# Patient Record
Sex: Female | Born: 1995
Health system: Southern US, Community
[De-identification: ages and names within clinical notes are randomized; demographics above are authoritative.]

## PROBLEM LIST (undated history)

## (undated) DIAGNOSIS — D571 Sickle-cell disease without crisis: Secondary | ICD-10-CM

## (undated) HISTORY — DX: Sickle-cell disease without crisis: D57.1

---
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
## 2016-06-28 ENCOUNTER — Encounter: Payer: Self-pay | Admitting: Obstetrics and Gynecology

## 2016-06-28 ENCOUNTER — Ambulatory Visit (INDEPENDENT_AMBULATORY_CARE_PROVIDER_SITE_OTHER): Payer: Medicaid Other | Admitting: Obstetrics and Gynecology

## 2016-06-28 ENCOUNTER — Other Ambulatory Visit (HOSPITAL_COMMUNITY)
Admission: RE | Admit: 2016-06-28 | Discharge: 2016-06-28 | Disposition: A | Discharge status: Home / Self Care | Payer: Medicaid Other | Source: Ambulatory Visit | Attending: Obstetrics and Gynecology | Admitting: Obstetrics and Gynecology

## 2016-06-28 DIAGNOSIS — Z01419 Encounter for gynecological examination (general) (routine) without abnormal findings: Secondary | ICD-10-CM

## 2016-06-28 DIAGNOSIS — Z0000 Encounter for general adult medical examination without abnormal findings: Secondary | ICD-10-CM | POA: Diagnosis not present

## 2016-06-28 DIAGNOSIS — Z202 Contact with and (suspected) exposure to infections with a predominantly sexual mode of transmission: Secondary | ICD-10-CM | POA: Insufficient documentation

## 2016-06-28 DIAGNOSIS — Z308 Encounter for other contraceptive management: Secondary | ICD-10-CM | POA: Insufficient documentation

## 2016-06-28 MED ORDER — NORETHIN ACE-ETH ESTRAD-FE 1-20 MG-MCG PO TABS: 1.0000 | ORAL_TABLET | Freq: Every day | ORAL | 11 refills | Status: DC

## 2016-06-28 NOTE — Addendum Note (Signed)
Addended by: Natale MilchSTALLING, Kajal Scalici D on: 06/28/2016 01:23 PM   Modules accepted: Orders

## 2016-06-28 NOTE — Progress Notes (Signed)
Patient is in the office for annual and to discuss getting back on BC pills. Pt stopped taking pills in May 2018.    Tammy Andrade is a 21 y.o.  female here for a routine annual gynecologic exam.  She denies any GYN problems. Previously on Junel, doing well, just needs refill. Sexual active without problems. No H/O STD's. No chronic medical problems or medications.   Denies abnormal vaginal bleeding, discharge, pelvic pain, problems with intercourse or other gynecologic concerns.    Gynecologic History Patient's last menstrual period was 06/06/2016. Contraception: OCP (estrogen/progesterone) Last Pap: NA.  Last mammogram: NA.   Obstetric History OB History  No data available    Past Medical History:  Diagnosis Date  . Sickle cell anemia (HCC) 1997    Past Surgical History:  Procedure Laterality Date  . LEG SURGERY  2003   broken bone    No current outpatient prescriptions on file prior to visit.   No current facility-administered medications on file prior to visit.     No Known Allergies  Social History   Social History  . Marital status: Single    Spouse name: N/A  . Number of children: N/A  . Years of education: N/A   Occupational History  . Not on file.   Social History Main Topics  . Smoking status: Never Smoker  . Smokeless tobacco: Not on file  . Alcohol use No  . Drug use: No  . Sexual activity: Yes    Birth control/ protection: Condom   Other Topics Concern  . Not on file   Social History Narrative  . No narrative on file    Family History  Problem Relation Age of Onset  . Diabetes Maternal Grandfather   . Hypertension Paternal Aunt     The following portions of the patient's history were reviewed and updated as appropriate: allergies, current medications, past family history, past medical history, past social history, past surgical history and problem list.  Review of Systems Pertinent items noted in HPI and remainder of comprehensive ROS  otherwise negative.   Objective:  BP 113/75   Pulse 92   Ht 5\' 8"  (1.727 m)   Wt 156 lb 14.4 oz (71.2 kg)   LMP 06/06/2016   BMI 23.86 kg/m  CONSTITUTIONAL: Well-developed, well-nourished female in no acute distress.  HENT:  Normocephalic, atraumatic, External right and left ear normal. Oropharynx is clear and moist EYES: Conjunctivae and EOM are normal. Pupils are equal, round, and reactive to light. No scleral icterus.  NECK: Normal range of motion, supple, no masses.  Normal thyroid.  SKIN: Skin is warm and dry. No rash noted. Not diaphoretic. No erythema. No pallor. NEUROLGIC: Alert and oriented to person, place, and time. Normal reflexes, muscle tone coordination. No cranial nerve deficit noted. PSYCHIATRIC: Normal mood and affect. Normal behavior. Normal judgment and thought content. CARDIOVASCULAR: Normal heart rate noted, regular rhythm RESPIRATORY: Clear to auscultation bilaterally. Effort and breath sounds normal, no problems with respiration noted. BREASTS: Symmetric in size. No masses, skin changes, nipple drainage, or lymphadenopathy. ABDOMEN: Soft, normal bowel sounds, no distention noted.  No tenderness, rebound or guarding.  PELVIC: Deferred MUSCULOSKELETAL: Normal range of motion. No tenderness.  No cyanosis, clubbing, or edema.  2+ distal pulses.   Assessment:  Annual gynecologic  Contraceptive management STD exposure   Plan:  Will renew Junel. Back up method recommended. R/B/U reviewed Routine preventative health maintenance measures emphasized. F/U in 1 yr or PRN Please refer to After Visit  Summary for other counseling recommendations.    Chancy Milroy, MD, [place] Attending [place] for [facility], [place]

## 2016-06-28 NOTE — Patient Instructions (Signed)

## 2016-06-29 LAB — GC/CHLAMYDIA PROBE AMP (~~LOC~~) NOT AT ARMC
Chlamydia: NEGATIVE
Neisseria Gonorrhea: NEGATIVE

## 2016-09-28 DIAGNOSIS — H5203 Hypermetropia, bilateral: Secondary | ICD-10-CM | POA: Diagnosis not present

## 2017-01-17 ENCOUNTER — Encounter: Payer: Self-pay | Admitting: Certified Nurse Midwife

## 2017-01-17 ENCOUNTER — Ambulatory Visit: Payer: Self-pay | Admitting: Certified Nurse Midwife

## 2017-01-17 VITALS — BP 112/81 | HR 94 | Wt 166.3 lb

## 2017-01-17 DIAGNOSIS — N939 Abnormal uterine and vaginal bleeding, unspecified: Secondary | ICD-10-CM | POA: Diagnosis not present

## 2017-01-17 DIAGNOSIS — Z113 Encounter for screening for infections with a predominantly sexual mode of transmission: Secondary | ICD-10-CM | POA: Diagnosis not present

## 2017-01-17 NOTE — Progress Notes (Signed)
Patient ID: Tammy Andrade, female   DOB: [birth year], 22 y.o.   MRN: [id number]  Chief Complaint  Patient presents with  . Vaginal Bleeding    Only during/after sex    HPI Tammy Andrade is a 22 y.o. female.  Here for spotting during sex/after sex X2 episodes.  Reports pain with deep penetration especially "doggy style".  Different sexual intercourse positions discussed.  Has a new partner for 4 months.  Swab obtained.  Declines blood STD testing.  Newly turned 21 since last exam, has changed insurances and desires full annual exam with pap smear.    HPI  Past Medical History:  Diagnosis Date  . Sickle cell anemia (HCC) 1997    Past Surgical History:  Procedure Laterality Date  . LEG SURGERY  2003   broken bone    Family History  Problem Relation Age of Onset  . Diabetes Maternal Grandfather   . Hypertension Paternal Aunt     Social History Social History   Tobacco Use  . Smoking status: Never Smoker  . Smokeless tobacco: Never Used  Substance Use Topics  . Alcohol use: No  . Drug use: No    No Known Allergies  Current Outpatient Medications  Medication Sig Dispense Refill  . norethindrone-ethinyl estradiol (JUNEL FE 1/20) 1-20 MG-MCG tablet Take 1 tablet by mouth daily. 1 Package 11   No current facility-administered medications for this visit.     Review of Systems Review of Systems Constitutional: negative for fatigue and weight loss Respiratory: negative for cough and wheezing Cardiovascular: negative for chest pain, fatigue and palpitations Gastrointestinal: negative for abdominal pain and change in bowel habits Genitourinary: +AUB Integument/breast: negative for nipple discharge Musculoskeletal:negative for myalgias Neurological: negative for gait problems and tremors Behavioral/Psych: negative for abusive relationship, depression Endocrine: negative for temperature intolerance      Blood pressure 112/81, pulse 94, weight 166 lb 4.8 oz (75.4 kg), last  menstrual period 01/11/2017.  Physical Exam Physical Exam General:   alert  Skin:   no rash or abnormalities  Lungs:   clear to auscultation bilaterally  Heart:   regular rate and rhythm, S1, S2 normal, no murmur, click, rub or gallop  Breasts:   deferred  Abdomen:  normal findings: no organomegaly, soft, non-tender and no hernia  Pelvis:  External genitalia: normal general appearance Urinary system: urethral meatus normal and bladder without fullness, nontender Vaginal: normal without tenderness, induration or masses Cervix: no CMT Adnexa: normal bimanual exam Uterus: anteverted and non-tender, normal size    50% of 15 min visit spent on counseling and coordination of care.   Data Reviewed Previous medical hx, meds, labs  Assessment      AUB with sexual intercourse r/t position  Screening for STDs  High risk sexual behaviors    Plan  [place] Swab  Continue Junelle Contraception/OCPs  F/U Annual exam with pap smear within 6 months.

## 2017-01-18 LAB — CERVICOVAGINAL ANCILLARY ONLY
Bacterial vaginitis: POSITIVE — AB
CHLAMYDIA, DNA PROBE: NEGATIVE
Candida vaginitis: NEGATIVE
Neisseria Gonorrhea: NEGATIVE
TRICH (WINDOWPATH): NEGATIVE

## 2017-01-25 ENCOUNTER — Other Ambulatory Visit: Payer: Self-pay | Admitting: Certified Nurse Midwife

## 2017-01-25 DIAGNOSIS — N760 Acute vaginitis: Secondary | ICD-10-CM

## 2017-01-25 DIAGNOSIS — B9689 Other specified bacterial agents as the cause of diseases classified elsewhere: Secondary | ICD-10-CM

## 2017-01-25 MED ORDER — TINIDAZOLE 500 MG PO TABS: 2.0000 g | ORAL_TABLET | Freq: Every day | ORAL | 0 refills | Status: DC

## 2017-02-06 ENCOUNTER — Inpatient Hospital Stay (HOSPITAL_COMMUNITY)
Admission: EM | Admit: 2017-02-06 | Discharge: 2017-02-08 | DRG: 690 | Disposition: A | Discharge status: Home / Self Care | Payer: BLUE CROSS/BLUE SHIELD | Attending: Internal Medicine | Admitting: Internal Medicine

## 2017-02-06 ENCOUNTER — Emergency Department (HOSPITAL_COMMUNITY): Payer: BLUE CROSS/BLUE SHIELD

## 2017-02-06 ENCOUNTER — Encounter (HOSPITAL_COMMUNITY): Payer: Self-pay | Admitting: Emergency Medicine

## 2017-02-06 ENCOUNTER — Other Ambulatory Visit: Payer: Self-pay

## 2017-02-06 DIAGNOSIS — R5081 Fever presenting with conditions classified elsewhere: Secondary | ICD-10-CM | POA: Diagnosis present

## 2017-02-06 DIAGNOSIS — D571 Sickle-cell disease without crisis: Secondary | ICD-10-CM | POA: Diagnosis not present

## 2017-02-06 DIAGNOSIS — D72829 Elevated white blood cell count, unspecified: Secondary | ICD-10-CM | POA: Diagnosis not present

## 2017-02-06 DIAGNOSIS — N390 Urinary tract infection, site not specified: Secondary | ICD-10-CM | POA: Diagnosis not present

## 2017-02-06 DIAGNOSIS — N12 Tubulo-interstitial nephritis, not specified as acute or chronic: Secondary | ICD-10-CM | POA: Diagnosis present

## 2017-02-06 DIAGNOSIS — N10 Acute tubulo-interstitial nephritis: Secondary | ICD-10-CM | POA: Diagnosis not present

## 2017-02-06 DIAGNOSIS — D5720 Sickle-cell/Hb-C disease without crisis: Secondary | ICD-10-CM | POA: Diagnosis not present

## 2017-02-06 DIAGNOSIS — R109 Unspecified abdominal pain: Secondary | ICD-10-CM | POA: Diagnosis not present

## 2017-02-06 DIAGNOSIS — N898 Other specified noninflammatory disorders of vagina: Secondary | ICD-10-CM | POA: Diagnosis not present

## 2017-02-06 LAB — URINALYSIS, ROUTINE W REFLEX MICROSCOPIC
BILIRUBIN URINE: NEGATIVE
Glucose, UA: NEGATIVE mg/dL
Ketones, ur: 20 mg/dL — AB
Nitrite: POSITIVE — AB
PH: 6 (ref 5.0–8.0)
Protein, ur: 100 mg/dL — AB
SPECIFIC GRAVITY, URINE: 1.01 (ref 1.005–1.030)

## 2017-02-06 LAB — I-STAT CG4 LACTIC ACID, ED
LACTIC ACID, VENOUS: 1.82 mmol/L (ref 0.5–1.9)
LACTIC ACID, VENOUS: 0.62 mmol/L (ref 0.5–1.9)

## 2017-02-06 LAB — CBC WITH DIFFERENTIAL/PLATELET
BASOS ABS: 0 10*3/uL (ref 0.0–0.1)
Basophils Relative: 0 %
Eosinophils Absolute: 0 10*3/uL (ref 0.0–0.7)
Eosinophils Relative: 0 %
HCT: 28.3 % — ABNORMAL LOW (ref 36.0–46.0)
Hemoglobin: 10.4 g/dL — ABNORMAL LOW (ref 12.0–15.0)
LYMPHS PCT: 12 %
Lymphs Abs: 3.3 10*3/uL (ref 0.7–4.0)
MCH: 29.5 pg (ref 26.0–34.0)
MCHC: 36.7 g/dL — ABNORMAL HIGH (ref 30.0–36.0)
MCV: 80.4 fL (ref 78.0–100.0)
Monocytes Absolute: 3 10*3/uL — ABNORMAL HIGH (ref 0.1–1.0)
Monocytes Relative: 11 %
NEUTROS PCT: 77 %
Neutro Abs: 21 10*3/uL — ABNORMAL HIGH (ref 1.7–7.7)
PLATELETS: 379 10*3/uL (ref 150–400)
RBC: 3.52 MIL/uL — ABNORMAL LOW (ref 3.87–5.11)
RDW: 14.1 % (ref 11.5–15.5)
WBC: 27.3 10*3/uL — AB (ref 4.0–10.5)

## 2017-02-06 LAB — BASIC METABOLIC PANEL
ANION GAP: 11 (ref 5–15)
BUN: 6 mg/dL (ref 6–20)
CALCIUM: 9.2 mg/dL (ref 8.9–10.3)
CO2: 24 mmol/L (ref 22–32)
Chloride: 103 mmol/L (ref 101–111)
Creatinine, Ser: 0.68 mg/dL (ref 0.44–1.00)
GFR calc Af Amer: >60 mL/min (ref >60)
Glucose, Bld: 72 mg/dL (ref 65–99)
POTASSIUM: 3.9 mmol/L (ref 3.5–5.1)
SODIUM: 138 mmol/L (ref 135–145)

## 2017-02-06 LAB — I-STAT BETA HCG BLOOD, ED (MC, WL, AP ONLY): HCG, QUANTITATIVE: 38.8 m[IU]/mL — AB (ref <5)

## 2017-02-06 LAB — HCG, QUANTITATIVE, PREGNANCY

## 2017-02-06 MED ORDER — DEXTROSE 5 % IV SOLN
1.0000 g | INTRAVENOUS | Status: DC
  Administered 2017-02-06: 1 g via INTRAVENOUS
  Filled 2017-02-06: qty 10

## 2017-02-06 NOTE — ED Provider Notes (Signed)
[facility]-EMERGENCY DEPT Provider Note   CSN: [id number] Arrival date & time: 02/06/17  1601     History   Chief Complaint Chief Complaint  Patient presents with  . elevated WBC  . Urinary Frequency    HPI Terriah Reggio is a 22 y.o. female.  HPI Patient presents with fevers dysuria and bilateral flank pain.  Began around 4-5 days ago.  [facility]en [facility] student [facility] center and had urinary tract infection and had systemic white count of greater than 20,000.  Pain is on both sides.  Has a history of sickle cell.  States she thinks she has type [place] but she is not sure.  States she has a doctor down in [place] at no font for it.  Has had nausea without vomiting.  Some mild abdominal pain in the upper abdomen also.  No cough. Past Medical History:  Diagnosis Date  . Sickle cell anemia (HCC) 1997    Patient Active Problem List   Diagnosis Date Noted  . Gynecologic exam normal 06/28/2016  . Encounter for other contraceptive management 06/28/2016  . STD exposure 06/28/2016    Past Surgical History:  Procedure Laterality Date  . LEG SURGERY  2003   broken bone    OB History    Gravida Para Term Preterm AB Living   0 0 0 0 0 0   SAB TAB Ectopic Multiple Live Births   0 0 0 0 0       Home Medications    Prior to Admission medications   Medication Sig Start Date End Date Taking? Authorizing Provider  ibuprofen (ADVIL,MOTRIN) 200 MG tablet Take 200 mg by mouth every 6 (six) hours as needed for fever or moderate pain.   Yes [provider]  norethindrone-ethinyl estradiol (JUNEL FE 1/20) 1-20 MG-MCG tablet Take 1 tablet by mouth daily. 06/28/16  Yes Hermina Staggers, MD  tinidazole (TINDAMAX) 500 MG tablet Take 4 tablets (2,000 mg total) by mouth daily with breakfast. Patient not taking: Reported on 02/06/2017 01/25/17   Roe Coombs, CNM    Family History Family History  Problem Relation Age of Onset  . Diabetes Maternal Grandfather   .  Hypertension Paternal Aunt     Social History Social History   Tobacco Use  . Smoking status: Never Smoker  . Smokeless tobacco: Never Used  Substance Use Topics  . Alcohol use: No  . Drug use: No     Allergies   Patient has no known allergies.   Review of Systems Review of Systems  Constitutional: Positive for appetite change and fever.  HENT: Negative for congestion.   Respiratory: Negative for shortness of breath.   Cardiovascular: Negative for chest pain.  Gastrointestinal: Positive for abdominal pain.  Genitourinary: Positive for dysuria and flank pain. Negative for decreased urine volume and pelvic pain.  Musculoskeletal: Positive for back pain.  Skin: Negative for rash.  Neurological: Negative for syncope.  Hematological: Negative for adenopathy.  Psychiatric/Behavioral: Negative for confusion.     Physical Exam Updated Vital Signs BP 125/86   Pulse 98   Temp (!) 101.5 F (38.6 C) (Oral)   Resp 18   Ht 5\' 8"  (1.727 m)   Wt 73.5 kg (162 lb)   LMP 01/23/2017   SpO2 100%   BMI 24.63 kg/m   Physical Exam  Constitutional: She appears well-developed.  HENT:  Head: Atraumatic.  Eyes: EOM are normal.  Cardiovascular:  Tachycardia  Abdominal: There is tenderness.  Mild  bilateral upper abdominal tenderness without rebound or guarding.  Genitourinary:  Genitourinary Comments: Bilateral CVA tenderness  Musculoskeletal: She exhibits no edema.  Neurological: She is alert.     ED Treatments / Results  Labs (all labs ordered are listed, but only abnormal results are displayed) Labs Reviewed  CBC WITH DIFFERENTIAL/PLATELET - Abnormal; Notable for the following components:      Result Value   WBC 27.3 (*)    RBC 3.52 (*)    Hemoglobin 10.4 (*)    HCT 28.3 (*)    MCHC 36.7 (*)    Neutro Abs 21.0 (*)    Monocytes Absolute 3.0 (*)    All other components within normal limits  URINALYSIS, ROUTINE W REFLEX MICROSCOPIC - Abnormal; Notable for the  following components:   Color, Urine AMBER (*)    APPearance CLOUDY (*)    Hgb urine dipstick MODERATE (*)    Ketones, ur 20 (*)    Protein, ur 100 (*)    Nitrite POSITIVE (*)    Leukocytes, UA LARGE (*)    Bacteria, UA MANY (*)    Squamous Epithelial / LPF 0-5 (*)    Non Squamous Epithelial 0-5 (*)    All other components within normal limits  I-STAT BETA HCG BLOOD, ED (MC, [facility], AP ONLY) - Abnormal; Notable for the following components:   I-stat hCG, quantitative 38.8 (*)    All other components within normal limits  URINE CULTURE  BASIC METABOLIC PANEL  HCG, QUANTITATIVE, PREGNANCY  I-STAT CG4 LACTIC ACID, ED  I-STAT CG4 LACTIC ACID, ED    EKG  EKG Interpretation None       Radiology Ct Renal Stone Study  Result Date: 02/06/2017 CLINICAL DATA:  Bilateral flank pain. Urinary tract infection. Elevated white count. EXAM: CT ABDOMEN AND PELVIS WITHOUT CONTRAST TECHNIQUE: Multidetector CT imaging of the abdomen and pelvis was performed following the standard protocol without IV contrast. COMPARISON:  None. FINDINGS: Lower chest: Mild abnormal density in both lower lungs consistent with atelectasis. No pleural effusion. Hepatobiliary: Normal without contrast. Pancreas: Normal Spleen: Non-specific 1.5 cm low-density in the upper portion, quite likely benign and insignificant. Adrenals/Urinary Tract: Adrenal glands are normal. Kidneys are normal without contrast. No sign of surrounding inflammatory change or detectable hydronephrosis. Bladder appears normal. Stomach/Bowel: No abnormal bowel finding. Vascular/Lymphatic: Normal Reproductive: Normal. Other: No free fluid or air. Musculoskeletal: Normal IMPRESSION: Negative noncontrast CT. No definable urinary tract pathology by this exam. No stone disease. No sign of hydronephrosis. Electronically Signed   By: Paulina Fusi M.D.   On: 02/06/2017 20:40    Procedures Procedures (including critical care time)  Medications Ordered in  ED Medications  cefTRIAXone (ROCEPHIN) 1 g in dextrose 5 % 50 mL IVPB (0 g Intravenous Stopped 02/06/17 1841)     Initial Impression / Assessment and Plan / ED Course  I have reviewed the triage vital signs and the nursing notes.  Pertinent labs & imaging results that were available during my care of the patient were reviewed by me and considered in my medical decision making (see chart for details).     Patient presents with fevers chills and urinary tract infection.  White count elevated.  Normal lactic acid.  Heart rate is improved with IV fluids.  However with bilateral flank pain and known sickle cell disease believe patient would benefit from admission.  CT scan done due to flank pain and no stone or other obstruction.  Will admit to hospitalist.  Final Clinical Impressions(s) /  ED Diagnoses   Final diagnoses:  Pyelonephritis  Hb-SS disease without crisis [facility](HCC)    ED Discharge Orders    None       Benjiman CorePickering, Juletta Berhe, MD 02/06/17 2059

## 2017-02-06 NOTE — ED Notes (Signed)
Pt is alert and oriented x 4 and is verbally responsive.  

## 2017-02-06 NOTE — ED Provider Notes (Signed)
Patient placed in Quick Look pathway, seen and evaluated   Chief Complaint:uti  HPI:   22 y/o female w/ flank pain, dysuria, and bad smelling urine Went to [facility] and UA showed infection and WBC > 20,000 Sent here for eval    ROS: no emesi (one)  Physical Exam:   Gen: No distress  Neuro: Awake and Alert  Skin: Warm    Focused Exam: bilateral CVAT   Initiation of care has begun. The patient has been counseled on the process, plan, and necessity for staying for the completion/evaluation, and the remainder of the medical screening examination   Lorre NickAllen, Ralpheal Zappone, MD 02/06/17 [phone]

## 2017-02-06 NOTE — ED Triage Notes (Signed)
Patient c/o urinary symptoms that started last Wed and took AZO OTC and cranberry juice thinking it would get better. Patient c/o bilat flank pain. Went to clinic at school and was told that urine was "nasty" and her WBC was 21.

## 2017-02-07 LAB — BASIC METABOLIC PANEL
Anion gap: 7 (ref 5–15)
BUN: 7 mg/dL (ref 6–20)
CO2: 24 mmol/L (ref 22–32)
CREATININE: 0.55 mg/dL (ref 0.44–1.00)
Calcium: 8.1 mg/dL — ABNORMAL LOW (ref 8.9–10.3)
Chloride: 106 mmol/L (ref 101–111)
GFR calc Af Amer: >60 mL/min (ref >60)
GLUCOSE: 95 mg/dL (ref 65–99)
Potassium: 3.3 mmol/L — ABNORMAL LOW (ref 3.5–5.1)
SODIUM: 137 mmol/L (ref 135–145)

## 2017-02-07 LAB — DIFFERENTIAL
BASOS ABS: 0.1 10*3/uL (ref 0.0–0.1)
Basophils Relative: 0 %
EOS ABS: 0.3 10*3/uL (ref 0.0–0.7)
Eosinophils Relative: 1 %
LYMPHS ABS: 5.8 10*3/uL — AB (ref 0.7–4.0)
LYMPHS PCT: 24 %
MONOS PCT: 13 %
Monocytes Absolute: 3.2 10*3/uL — ABNORMAL HIGH (ref 0.1–1.0)
Neutro Abs: 14.6 10*3/uL — ABNORMAL HIGH (ref 1.7–7.7)
Neutrophils Relative %: 62 %

## 2017-02-07 LAB — CBC
HCT: 23.6 % — ABNORMAL LOW (ref 36.0–46.0)
Hemoglobin: 8.6 g/dL — ABNORMAL LOW (ref 12.0–15.0)
MCH: 29.1 pg (ref 26.0–34.0)
MCHC: 36.4 g/dL — AB (ref 30.0–36.0)
MCV: 79.7 fL (ref 78.0–100.0)
PLATELETS: 296 10*3/uL (ref 150–400)
RBC: 2.96 MIL/uL — ABNORMAL LOW (ref 3.87–5.11)
RDW: 14.1 % (ref 11.5–15.5)
WBC: 23.5 10*3/uL — AB (ref 4.0–10.5)

## 2017-02-07 LAB — MAGNESIUM: Magnesium: 2 mg/dL (ref 1.7–2.4)

## 2017-02-07 LAB — RETICULOCYTES
RBC.: 2.93 MIL/uL — AB (ref 3.87–5.11)
RETIC CT PCT: 5.3 % — AB (ref 0.4–3.1)
Retic Count, Absolute: 155.3 10*3/uL (ref 19.0–186.0)

## 2017-02-07 LAB — HIV ANTIBODY (ROUTINE TESTING W REFLEX): HIV SCREEN 4TH GENERATION: NONREACTIVE

## 2017-02-07 LAB — GLUCOSE, CAPILLARY: Glucose-Capillary: 95 mg/dL (ref 65–99)

## 2017-02-07 MED ORDER — ENOXAPARIN SODIUM 40 MG/0.4ML ~~LOC~~ SOLN: 40.0000 mg | Freq: Every day | SUBCUTANEOUS | Status: DC

## 2017-02-07 MED ORDER — ACETAMINOPHEN 325 MG PO TABS
650.0000 mg | ORAL_TABLET | Freq: Four times a day (QID) | ORAL | Status: DC | PRN
  Administered 2017-02-07: 650 mg via ORAL
  Filled 2017-02-07: qty 2

## 2017-02-07 MED ORDER — POTASSIUM CHLORIDE IN NACL 20-0.9 MEQ/L-% IV SOLN
INTRAVENOUS | Status: DC
  Administered 2017-02-07: 01:00:00 via INTRAVENOUS
  Filled 2017-02-07 (×2): qty 1000

## 2017-02-07 MED ORDER — DEXTROSE 5 % IV SOLN
1.0000 g | Freq: Once | INTRAVENOUS | Status: AC
  Administered 2017-02-07: 1 g via INTRAVENOUS
  Filled 2017-02-07: qty 10

## 2017-02-07 MED ORDER — ONDANSETRON HCL 4 MG/2ML IJ SOLN: 4.0000 mg | Freq: Four times a day (QID) | INTRAMUSCULAR | Status: DC | PRN

## 2017-02-07 MED ORDER — POTASSIUM CHLORIDE IN NACL 20-0.9 MEQ/L-% IV SOLN
INTRAVENOUS | Status: DC
  Filled 2017-02-07: qty 1000

## 2017-02-07 MED ORDER — DEXTROSE 5 % IV SOLN
2.0000 g | INTRAVENOUS | Status: DC
  Administered 2017-02-07: 2 g via INTRAVENOUS
  Filled 2017-02-07: qty 2

## 2017-02-07 MED ORDER — ONDANSETRON HCL 4 MG PO TABS: 4.0000 mg | ORAL_TABLET | Freq: Four times a day (QID) | ORAL | Status: DC | PRN

## 2017-02-07 MED ORDER — IBUPROFEN 200 MG PO TABS
600.0000 mg | ORAL_TABLET | Freq: Three times a day (TID) | ORAL | Status: DC | PRN
  Administered 2017-02-07: 600 mg via ORAL
  Filled 2017-02-07: qty 3

## 2017-02-07 MED ORDER — HYDROCODONE-ACETAMINOPHEN 5-325 MG PO TABS: 1.0000 | ORAL_TABLET | Freq: Four times a day (QID) | ORAL | Status: DC | PRN

## 2017-02-07 MED ORDER — SENNOSIDES-DOCUSATE SODIUM 8.6-50 MG PO TABS
1.0000 | ORAL_TABLET | Freq: Two times a day (BID) | ORAL | Status: DC
  Administered 2017-02-07 – 2017-02-08 (×3): 1 via ORAL
  Filled 2017-02-07 (×3): qty 1

## 2017-02-07 NOTE — Progress Notes (Signed)
Patient ID: Tammy Andrade, female   DOB: [birth year], 22 y.o.   MRN: [id number] Pt with Hb Jal admitted with acute  Pyelonephritis. Clinically doing well on Ceftriaxone. Urine cultures are pending and are negative so far. Will continue Ceftriaxone and change to appropriate antibiotics guided by cultures.   Manjot Hinks A.

## 2017-02-07 NOTE — H&P (Signed)
History and Physical    Tammy SilosKiah Brazier NGE:[id number] DOB: 10-21-1995 DOA: 02/06/2017  PCP: Patient, No Pcp Per   Patient coming from: home  Chief Complaint: Fever, burning with urination, bilat flank pain  HPI: Tammy Andrade is a 22 y.o. female with hx of sickle cell anemia, presented to the ED today with complaints of fever burning urination and bilateral flank pain.  Patient reports dysuria started first, about a week ago, she took AZO cranberry juice without improvement, then bilateral flank pain started 5 days ago, fever and chills 4 days ago.  Patient visited her school clinic [employer] today and was told her WBCs were elevated and that her urine was "nasty". Patient denies shortness of breath or chest pain no diarrhea or vomiting no abdominal pain, appetite has been reduced because of pain, no headache photophobia neck pain or stiffness. She reports some mild yellowish vaginal discharge, without itching or odor.   ED Course: Fever in the ED to 101.5, hematin mild tachycardia to 117.  CBC elevated at 27.3, hemoglobin mildly low at 10.4.  Normal lactic acid. UA-large leukocytes positive nitrite, many bacteria.  CT stone study negative for stone or hydronephrosis.  She was started on 1 gm ceftriaxone in the ED, urine cultures were drawn hospitalist was called to admit for UTI sepsis.  Review of Systems: As per HPI otherwise 10 point review of systems negative.   Past Medical History:  Diagnosis Date  . Sickle cell anemia (HCC) 1997    Past Surgical History:  Procedure Laterality Date  . LEG SURGERY  2003   broken bone    reports that  has never smoked. she has never used smokeless tobacco. She reports that she does not drink alcohol or use drugs.  No Known Allergies  Family History  Problem Relation Age of Onset  . Diabetes Maternal Grandfather   . Hypertension Paternal Aunt     Prior to Admission medications   Medication Sig Start Date End Date Taking? Authorizing Provider  ibuprofen  (ADVIL,MOTRIN) 200 MG tablet Take 200 mg by mouth every 6 (six) hours as needed for fever or moderate pain.   Yes [provider]  norethindrone-ethinyl estradiol (JUNEL FE 1/20) 1-20 MG-MCG tablet Take 1 tablet by mouth daily. 06/28/16  Yes Hermina StaggersErvin, Michael L, MD    Physical Exam: Vitals:   02/06/17 2056 02/06/17 2115 02/06/17 2145 02/06/17 2232  BP: 125/86   125/81  Pulse: 98 100 (!) 109 (!) 106  Resp: 18   18  Temp:    (!) 100.4 F (38 C)  TempSrc:    Oral  SpO2: 100% 100% 100% 100%  Weight:    74.3 kg (163 lb 12.8 oz)  Height:    5\' 8"  (1.727 m)    Constitutional: NAD, calm, comfortable Vitals:   02/06/17 2056 02/06/17 2115 02/06/17 2145 02/06/17 2232  BP: 125/86   125/81  Pulse: 98 100 (!) 109 (!) 106  Resp: 18   18  Temp:    (!) 100.4 F (38 C)  TempSrc:    Oral  SpO2: 100% 100% 100% 100%  Weight:    74.3 kg (163 lb 12.8 oz)  Height:    5\' 8"  (1.727 m)   Eyes: PERRL, lids and conjunctivae normal ENMT: Mucous membranes are moist. Posterior pharynx clear of any exudate or lesions.Normal dentition.  Neck: normal, supple, no masses, no thyromegaly Respiratory: clear to auscultation bilaterally, no wheezing, no crackles. Normal respiratory effort. No accessory muscle use.  Cardiovascular: Regular  rate and rhythm, no murmurs / rubs / gallops. No extremity edema. 2+ pedal pulses. No carotid bruits.  Abdomen: no tenderness, no masses palpated. No hepatosplenomegaly. Bowel sounds positive.  Left CVA tenderness. Musculoskeletal: no clubbing / cyanosis. No joint deformity upper and lower extremities. Good ROM, no contractures. Normal muscle tone.  Skin: no rashes, lesions, ulcers. No induration Neurologic: CN 2-12 grossly intact. Sensation intact, DTR normal. Strength 5/5 in all 4.  Psychiatric: Normal judgment and insight. Alert and oriented x 3. Normal mood.   Labs on Admission: I have personally reviewed following labs and imaging studies  CBC: Recent Labs  Lab  02/06/17 1808  WBC 27.3*  NEUTROABS 21.0*  HGB 10.4*  HCT 28.3*  MCV 80.4  PLT 379   Basic Metabolic Panel: Recent Labs  Lab 02/06/17 1808  NA 138  K 3.9  CL 103  CO2 24  GLUCOSE 72  BUN 6  CREATININE 0.68  CALCIUM 9.2   Urine analysis:    Component Value Date/Time   COLORURINE AMBER (A) 02/06/2017 1808   APPEARANCEUR CLOUDY (A) 02/06/2017 1808   LABSPEC 1.010 02/06/2017 1808   PHURINE 6.0 02/06/2017 1808   GLUCOSEU NEGATIVE 02/06/2017 1808   HGBUR MODERATE (A) 02/06/2017 1808   BILIRUBINUR NEGATIVE 02/06/2017 1808   KETONESUR 20 (A) 02/06/2017 1808   PROTEINUR 100 (A) 02/06/2017 1808   NITRITE POSITIVE (A) 02/06/2017 1808   LEUKOCYTESUR LARGE (A) 02/06/2017 1808    Radiological Exams on Admission: Ct Renal Stone Study  Result Date: 02/06/2017 CLINICAL DATA:  Bilateral flank pain. Urinary tract infection. Elevated white count. EXAM: CT ABDOMEN AND PELVIS WITHOUT CONTRAST TECHNIQUE: Multidetector CT imaging of the abdomen and pelvis was performed following the standard protocol without IV contrast. COMPARISON:  None. FINDINGS: Lower chest: Mild abnormal density in both lower lungs consistent with atelectasis. No pleural effusion. Hepatobiliary: Normal without contrast. Pancreas: Normal Spleen: Non-specific 1.5 cm low-density in the upper portion, quite likely benign and insignificant. Adrenals/Urinary Tract: Adrenal glands are normal. Kidneys are normal without contrast. No sign of surrounding inflammatory change or detectable hydronephrosis. Bladder appears normal. Stomach/Bowel: No abnormal bowel finding. Vascular/Lymphatic: Normal Reproductive: Normal. Other: No free fluid or air. Musculoskeletal: Normal IMPRESSION: Negative noncontrast CT. No definable urinary tract pathology by this exam. No stone disease. No sign of hydronephrosis. Electronically Signed   By: Paulina Fusi M.D.   On: 02/06/2017 20:40    EKG: None.  Assessment/Plan Principal Problem:    Pyelonephritis  Pyelonephritis with sepsis-flank pain, dysuria, fevers.  WBC elevated 27, tachycardia.  Lactic acid normal.  UA suggestive of UTI. renal CT stone study negative for stone or hydronephrosis.  IV ceftriaxone 1gm given the ED -We will continue IV ceftriaxone but at increased dose 2 g daily -Follow-up urine cultures drawn in ED -Patient is septic and will also order blood cultures -Ibuprofen 40 mg every 6 hourly as needed for pain. -EKG for tachycardia -IVF normal saline + 20 KCL 100 cc/h for 10 hours - BMP, CBC a.m  Sickle cell disease- hgb 10.4.  No prior to compare.  Apparently follows with a provider in [place].  No history of bleeding   DVT prophylaxis: Lovenox Code Status: Full  Family Communication: Family at bedside. Disposition Plan: 2- 3 days  Consults called: None  Admission status: Inpt, tele   Onnie Boer MD [facility] Pager [phone]  If 6PM-4AM, please contact night-coverage [email or web address] Password [email or web address]  02/07/2017, 12:54 AM

## 2017-02-08 DIAGNOSIS — D72829 Elevated white blood cell count, unspecified: Secondary | ICD-10-CM

## 2017-02-08 DIAGNOSIS — D5720 Sickle-cell/Hb-C disease without crisis: Secondary | ICD-10-CM

## 2017-02-08 DIAGNOSIS — N12 Tubulo-interstitial nephritis, not specified as acute or chronic: Secondary | ICD-10-CM

## 2017-02-08 LAB — CBC WITH DIFFERENTIAL/PLATELET
BASOS PCT: 0 %
Basophils Absolute: 0 10*3/uL (ref 0.0–0.1)
Eosinophils Absolute: 0.4 10*3/uL (ref 0.0–0.7)
Eosinophils Relative: 2 %
HCT: 28.2 % — ABNORMAL LOW (ref 36.0–46.0)
HEMOGLOBIN: 10.5 g/dL — AB (ref 12.0–15.0)
LYMPHS PCT: 21 %
Lymphs Abs: 4 10*3/uL (ref 0.7–4.0)
MCH: 28.9 pg (ref 26.0–34.0)
MCHC: 37.2 g/dL — AB (ref 30.0–36.0)
MCV: 77.7 fL — ABNORMAL LOW (ref 78.0–100.0)
MONO ABS: 2.3 10*3/uL — AB (ref 0.1–1.0)
Monocytes Relative: 12 %
NEUTROS ABS: 12.4 10*3/uL — AB (ref 1.7–7.7)
NEUTROS PCT: 65 %
PLATELETS: 395 10*3/uL (ref 150–400)
RBC: 3.63 MIL/uL — ABNORMAL LOW (ref 3.87–5.11)
RDW: 14 % (ref 11.5–15.5)
WBC: 19.1 10*3/uL — AB (ref 4.0–10.5)

## 2017-02-08 MED ORDER — CIPROFLOXACIN HCL 500 MG PO TABS: 500.0000 mg | ORAL_TABLET | Freq: Two times a day (BID) | ORAL | 0 refills | Status: DC

## 2017-02-08 MED ORDER — CIPROFLOXACIN HCL 500 MG PO TABS
500.0000 mg | ORAL_TABLET | Freq: Two times a day (BID) | ORAL | Status: DC
  Administered 2017-02-08: 500 mg via ORAL
  Filled 2017-02-08: qty 1

## 2017-02-08 NOTE — Progress Notes (Signed)
Discharge instructions reviewed with patient. No questions or concerns at this time

## 2017-02-08 NOTE — Discharge Summary (Signed)
Tammy Andrade Thomley MRN: [id number] DOB/AGE: [birth year] 21 y.o.  Admit date: 02/06/2017 Discharge date: 02/08/2017  Primary Care Physician:  Patient, No Pcp Per   Discharge Diagnoses:   Patient Active Problem List   Diagnosis Date Noted  . Pyelonephritis 02/06/2017  . Gynecologic exam normal 06/28/2016  . Encounter for other contraceptive management 06/28/2016  . STD exposure 06/28/2016    DISCHARGE MEDICATION: Allergies as of 02/08/2017   No Known Allergies     Medication List    TAKE these medications   ciprofloxacin 500 MG tablet Commonly known as:  CIPRO Take 1 tablet (500 mg total) by mouth 2 (two) times daily.   ibuprofen 200 MG tablet Commonly known as:  ADVIL,MOTRIN Take 200 mg by mouth every 6 (six) hours as needed for fever or moderate pain.   norethindrone-ethinyl estradiol 1-20 MG-MCG tablet Commonly known as:  JUNEL FE 1/20 Take 1 tablet by mouth daily.         Consults:    SIGNIFICANT DIAGNOSTIC STUDIES:  Ct Renal Stone Study  Result Date: 02/06/2017 CLINICAL DATA:  Bilateral flank pain. Urinary tract infection. Elevated white count. EXAM: CT ABDOMEN AND PELVIS WITHOUT CONTRAST TECHNIQUE: Multidetector CT imaging of the abdomen and pelvis was performed following the standard protocol without IV contrast. COMPARISON:  None. FINDINGS: Lower chest: Mild abnormal density in both lower lungs consistent with atelectasis. No pleural effusion. Hepatobiliary: Normal without contrast. Pancreas: Normal Spleen: Non-specific 1.5 cm low-density in the upper portion, quite likely benign and insignificant. Adrenals/Urinary Tract: Adrenal glands are normal. Kidneys are normal without contrast. No sign of surrounding inflammatory change or detectable hydronephrosis. Bladder appears normal. Stomach/Bowel: No abnormal bowel finding. Vascular/Lymphatic: Normal Reproductive: Normal. Other: No free fluid or air. Musculoskeletal: Normal IMPRESSION: Negative noncontrast CT. No definable urinary  tract pathology by this exam. No stone disease. No sign of hydronephrosis. Electronically Signed   By: Paulina FusiMark  Shogry M.D.   On: 02/06/2017 20:40      Recent Results (from the past 240 hour(s))  Urine Culture     Status: Abnormal (Preliminary result)   Collection Time: 02/06/17  6:08 PM  Result Value Ref Range Status   Specimen Description   Final    URINE, RANDOM Performed at [facility], [address] [place], [place] [address]    Special Requests   Final    NONE Performed at [facility], [address] [place], [place] [address]    Culture 60,000 COLONIES/mL Romie MinusGRAM NEGATIVE RODS (A)  Final   Report Status PENDING  Incomplete    BRIEF ADMITTING H & P: Tammy Andrade Poplar is a 22 y.o. female with hx of sickle cell anemia, presented to the ED today with complaints of fever burning urination and bilateral flank pain.  Patient reports dysuria started first, about a week ago, she took AZO cranberry juice without improvement, then bilateral flank pain started 5 days ago, fever and chills 4 days ago.  Patient visited her school clinic [employer] today and was told her WBCs were elevated and that her urine was "nasty". Patient denies shortness of breath or chest pain no diarrhea or vomiting no abdominal pain, appetite has been reduced because of pain, no headache photophobia neck pain or stiffness. She reports some mild yellowish vaginal discharge, without itching or odor.   ED Course: Fever in the ED to 101.5, hematin mild tachycardia to 117.  CBC elevated at 27.3, hemoglobin mildly low at 10.4.  Normal lactic acid. UA-large leukocytes positive nitrite, many  bacteria.  CT stone study negative for stone or hydronephrosis.  She was started on 1 gm ceftriaxone in the ED, urine cultures were drawn hospitalist was called to admit for UTI sepsis.     Hospital Course:  Present on Admission: . Pyelonephritis  1. Acute Pyelonephritis with SIRS: Pt was admitted with SIRS  secondary to acute pyelonephritis. A lactic acid was within normal limits but her WBC was elevated. She was started on Ceftriaxone and IVF and had a significantt clinical improvement. Antibiotics were changed to ciprofloxacin once preliminary results of the culture were received. She is discharged on Ciprofloxacin 500 mg BID and I will follow up on the cultures and call patient if antibiotics need to be adjusted. Pt does not have a local Physician but at the patients request, I have spoken with her Primary Hematologist with whom I will communicate if there needs to be a change in antibiotics.  2. Dehydration: Pt had mild dehydration on admission. This resolved after IVF hydration. Pt tolerating oral intake without any difficulty.   3. Hb  without Crisis:Pt had no pain associated with sickle cell disease and Hb remained at baseline.  4. 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 She does not have a PMMD in the area. She will follow up with the health service as necessary. Additionally she will follow up with the [facility].   Disposition and Follow-up: Pt discharged in good condition on ciprofloxacin. I will follow up on cultures and make adjustments to antibiotics as necessary since patient does not have a Primary Care Provider.   DISCHARGE EXAM: General: Alert, awake, oriented x3, in mild distress.  HEENT: [place]/AT PEERL, EOMI, anicteric Neck: Trachea midline, no masses, no thyromegal,y no JVD, no carotid bruit OROPHARYNX: Moist, No exudate/ erythema/lesions.  Heart: Regular rate and rhythm, without murmurs, rubs, gallops or S3. PMI non-displaced. Exam reveals no decreased pulses. Pulmonary/Chest: Normal effort. Breath sounds normal. No. Apnea. Clear to auscultation,no stridor,  no wheezing and no rhonchi noted. No respiratory distress and no tenderness noted. Abdomen: Soft, nontender, nondistended, normal bowel sounds, no masses no  hepatosplenomegaly noted. No fluid wave and no ascites. There is no guarding or rebound. No CVA tenderness.  Neuro: Alert and oriented to person, place and time. Normal motor skills, Displays no atrophy or tremors and exhibits normal muscle tone.  No focal neurological deficits noted cranial nerves II through XII grossly intact. No sensory deficit noted. Strength at baseline in bilateral upper and lower extremities. Gait normal. Musculoskeletal: No warmth swelling or erythema around joints, no spinal tenderness noted. Psychiatric: Patient alert and oriented x3, good insight and cognition, good recent to remote recall. Mood, affect and judgement normal Lymph node survey: No cervical axillary or inguinal lymphadenopathy noted. Skin: Skin is warm and dry. No bruising, no ecchymosis and no rash noted. Pt is not diaphoretic. No erythema. No pallor    Blood pressure 126/80, pulse 86, temperature 98.8 F (37.1 C), temperature source Oral, resp. rate 16, height 5\' 8"  (1.727 m), weight 74.3 kg (163 lb 12.8 oz), last menstrual period 01/23/2017, SpO2 99 %.  Recent Labs    02/06/17 1808 02/07/17 0456  NA 138 137  K 3.9 3.3*  CL 103 106  CO2 24 24  GLUCOSE 72 95  BUN 6 7  CREATININE 0.68 0.55  CALCIUM 9.2 8.1*  MG  --  2.0   No results for input(s): AST, ALT, ALKPHOS, BILITOT,  PROT, ALBUMIN in the last 72 hours. No results for input(s): LIPASE, AMYLASE in the last 72 hours. Recent Labs    02/06/17 1808 02/07/17 0456  WBC 27.3* 23.5*  NEUTROABS 21.0* 14.6*  HGB 10.4* 8.6*  HCT 28.3* 23.6*  MCV 80.4 79.7  PLT 379 296     Total time spent including face to face and decision making was greater than 30 minutes  Signed: Tameshia Bonneville A. 02/08/2017, 12:31 PM

## 2017-02-09 ENCOUNTER — Ambulatory Visit: Payer: BLUE CROSS/BLUE SHIELD | Admitting: Certified Nurse Midwife

## 2017-02-09 LAB — URINE CULTURE

## 2017-02-12 LAB — CULTURE, BLOOD (ROUTINE X 2)
Culture: NO GROWTH
Culture: NO GROWTH
SPECIAL REQUESTS: ADEQUATE
Special Requests: ADEQUATE

## 2017-03-03 ENCOUNTER — Ambulatory Visit: Payer: BLUE CROSS/BLUE SHIELD | Admitting: Family Medicine

## 2017-03-13 ENCOUNTER — Ambulatory Visit: Payer: BLUE CROSS/BLUE SHIELD | Admitting: Family Medicine

## 2017-03-16 ENCOUNTER — Encounter: Payer: Self-pay | Admitting: Family Medicine

## 2017-03-16 ENCOUNTER — Ambulatory Visit (INDEPENDENT_AMBULATORY_CARE_PROVIDER_SITE_OTHER): Payer: BLUE CROSS/BLUE SHIELD | Admitting: Family Medicine

## 2017-03-16 VITALS — BP 121/60 | HR 82 | Temp 98.4°F | Resp 14 | Ht 68.0 in | Wt 164.0 lb

## 2017-03-16 DIAGNOSIS — R8290 Unspecified abnormal findings in urine: Secondary | ICD-10-CM

## 2017-03-16 DIAGNOSIS — D5720 Sickle-cell/Hb-C disease without crisis: Secondary | ICD-10-CM | POA: Diagnosis not present

## 2017-03-16 LAB — POCT URINALYSIS DIP (DEVICE)
BILIRUBIN URINE: NEGATIVE
GLUCOSE, UA: NEGATIVE mg/dL
KETONES UR: NEGATIVE mg/dL
Nitrite: NEGATIVE
PH: 6 (ref 5.0–8.0)
Protein, ur: NEGATIVE mg/dL
Specific Gravity, Urine: 1.015 (ref 1.005–1.030)
Urobilinogen, UA: 0.2 mg/dL (ref 0.0–1.0)

## 2017-03-16 MED ORDER — FOLIC ACID 1 MG PO TABS: 1.0000 mg | ORAL_TABLET | Freq: Every day | ORAL | 1 refills | Status: AC

## 2017-03-16 NOTE — Progress Notes (Signed)
Subjective:    Patient ID: Tammy Andrade, female    DOB: Apr 08, 1995, 22 y.o.   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 Patient has a history of sickle cell anemia, HbSC and was primarily using the health center at [employer] for all medical needs. She is a patient of Dr. Beverely PaceBryant, hematology and typically follows up yearly. She says that he agreed to continue seeing her until age 22. Patient says that she rarely has crises. She recently admitted to inpatient services for pyelonephritis on 02/06/2017. She was discharge on oral antibiotics. She feels well on today and is without complaint.  She currently does not have pain. She denies headache, blurred vision, heart palpitations, nausea, vomiting, or diarrhea. She does not hydrate frequently and does not take medications for sickle cell. Patient is up to date with pap smear and is on oral contraception prescribed by student health center.     Past Medical History:  Diagnosis Date  . Sickle cell anemia (HCC) 1997   There is no immunization history on file for this patient.   Social History   Socioeconomic History  . Marital status: Single    Spouse name: Not on file  . Number of children: Not on file  . Years of education: Not on file  . Highest education level: Not on file  Social Needs  . Financial resource strain: Not on file  . Food insecurity - worry: Not on file  . Food insecurity - inability: Not on file  . Transportation needs - medical: Not on file  . Transportation needs - non-medical: Not on file  Occupational History  . Not on file  Tobacco Use  . Smoking status: Never Smoker  . Smokeless tobacco: Never Used  Substance and Sexual Activity  . Alcohol use: Yes    Comment: occ  . Drug use: No  . Sexual activity: Yes    Birth control/protection: Condom, Pill  Other Topics Concern  . Not on file  Social History Narrative  . Not on file   Review of Systems  HENT:  Negative.   Eyes: Negative for photophobia and visual disturbance.  Respiratory: Negative.   Cardiovascular: Negative.   Gastrointestinal: Negative.   Endocrine: Negative for polydipsia, polyphagia and polyuria.  Genitourinary: Negative.  Negative for dysuria, enuresis, flank pain and hematuria.  Musculoskeletal: Negative.  Negative for arthralgias.  Skin: Negative.   Allergic/Immunologic: Negative for food allergies and immunocompromised state.  Hematological: Negative.   Psychiatric/Behavioral: Negative.        Objective:   Physical Exam  Constitutional: She is oriented to person, place, and time.  HENT:  Head: Normocephalic and atraumatic.  Right Ear: External ear normal.  Left Ear: External ear normal.  Nose: Nose normal.  Mouth/Throat: Oropharynx is clear and moist.  Eyes: Conjunctivae are normal. Pupils are equal, round, and reactive to light.  Neck: Normal range of motion. Neck supple.  Cardiovascular: Normal rate, normal heart sounds and intact distal pulses.  Pulmonary/Chest: Effort normal and breath sounds normal.  Abdominal: Soft. Bowel sounds are normal.  Musculoskeletal: Normal range of motion.  Neurological: She is alert and oriented to person, place, and time. She has normal reflexes.  Skin: Skin is warm and dry.  Psychiatric: She has a normal mood and affect. Her behavior is normal. Judgment and thought content normal.      BP 121/60 (BP Location: Left Arm, Patient Position: Sitting, Cuff Size: Normal)  Pulse 82   Temp 98.4 F (36.9 C) (Oral)   Resp 14   Ht 5\' 8"  (1.727 m)   Wt 164 lb (74.4 kg)   LMP 02/24/2017   SpO2 100%   BMI 24.94 kg/m  Assessment & Plan:  1. Sickle cell-hemoglobin C disease without crisis (HCC) Will start folic acid 1 mg daily to prevent aplastic bone marrow crises.   Eye -  Annual eye exam with retinal exam recommended to patient.  Immunization status - She is up to date with vaccinations  - folic acid (FOLVITE) 1 MG  tablet; Take 1 tablet (1 mg total) by mouth daily.  Dispense: 90 tablet; Refill: 1 - CBC with Differential - Vitamin D, 25-hydroxy - Comprehensive metabolic panel - EKG 12-Lead  2. Abnormal urinalysis  - Urine Culture   RTC: 6 months for sickle cell anemia   Nolon Nations  MSN, FNP-C Patient Care [facility] [address] [place], [place] [address] [phone]

## 2017-03-16 NOTE — Patient Instructions (Signed)
Will start folic acid 1 mg daily to prevent vasoocclusive crisis  Discussed the importance of drinking 64 ounces of water daily.This is because dehydration of red blood cells may lead to the sickling process.   Sickle Cell Anemia, Adult Sickle cell anemia is a condition where your red blood cells are shaped like sickles. Red blood cells carry oxygen through the body. Sickle-shaped red blood cells do not live as long as normal red blood cells. They also clump together and block blood from flowing through the blood vessels. These things prevent the body from getting enough oxygen. Sickle cell anemia causes organ damage and pain. It also increases the risk of infection. Follow these instructions at home:  Drink enough fluid to keep your pee (urine) clear or pale yellow. Drink more in hot weather and during exercise.  Do not smoke. Smoking lowers oxygen levels in the blood.  Only take over-the-counter or prescription medicines as told by your doctor.  Take antibiotic medicines as told by your doctor. Make sure you finish them even if you start to feel better.  Take supplements as told by your doctor.  Consider wearing a medical alert bracelet. This tells anyone caring for you in an emergency of your condition.  When traveling, keep your medical information, doctors' names, and the medicines you take with you at all times.  If you have a fever, do not take fever medicines right away. This could cover up a problem. Tell your doctor.  Keep all follow-up visits with your doctor. Sickle cell anemia requires regular medical care. Contact a doctor if: You have a fever. Get help right away if:  You feel dizzy or faint.  You have new belly (abdominal) pain, especially on the left side near the stomach area.  You have a lasting, often uncomfortable and painful erection of the penis (priapism). If it is not treated right away, you will become unable to have sex (impotence).  You have numbness in  your arms or legs or you have a hard time moving them.  You have a hard time talking.  You have a fever or lasting symptoms for more than 2-3 days.  You have a fever and your symptoms suddenly get worse.  You have signs or symptoms of infection. These include: ? Chills. ? Being more tired than normal (lethargy). ? Irritability. ? Poor eating. ? Throwing up (vomiting).  You have pain that is not helped with medicine.  You have shortness of breath.  You have pain in your chest.  You are coughing up pus-like or bloody mucus.  You have a stiff neck.  Your feet or hands swell or have pain.  Your belly looks bloated.  Your joints hurt. This information is not intended to replace advice given to you by your health care provider. Make sure you discuss any questions you have with your health care provider. Document Released: 10/10/2012 Document Revised: 05/28/2015 Document Reviewed: 08/01/2012 Elsevier Interactive Patient Education  2017 [employer]

## 2017-03-17 ENCOUNTER — Telehealth: Payer: Self-pay

## 2017-03-17 LAB — CBC WITH DIFFERENTIAL/PLATELET
Basophils Absolute: 0.1 10*3/uL (ref 0.0–0.2)
Basos: 0 %
EOS (ABSOLUTE): 0.3 10*3/uL (ref 0.0–0.4)
EOS: 2 %
HEMATOCRIT: 32.4 % — AB (ref 34.0–46.6)
Hemoglobin: 11.4 g/dL (ref 11.1–15.9)
IMMATURE GRANS (ABS): 0 10*3/uL (ref 0.0–0.1)
Immature Granulocytes: 0 %
LYMPHS: 20 %
Lymphocytes Absolute: 3.2 10*3/uL — ABNORMAL HIGH (ref 0.7–3.1)
MCH: 30.6 pg (ref 26.6–33.0)
MCHC: 35.2 g/dL (ref 31.5–35.7)
MCV: 87 fL (ref 79–97)
MONOS ABS: 1.1 10*3/uL — AB (ref 0.1–0.9)
Monocytes: 7 %
NEUTROS ABS: 11.7 10*3/uL — AB (ref 1.4–7.0)
Neutrophils: 71 %
PLATELETS: 331 10*3/uL (ref 150–379)
RBC: 3.73 x10E6/uL — ABNORMAL LOW (ref 3.77–5.28)
RDW: 16.1 % — ABNORMAL HIGH (ref 12.3–15.4)
WBC: 16.3 10*3/uL — ABNORMAL HIGH (ref 3.4–10.8)

## 2017-03-17 LAB — COMPREHENSIVE METABOLIC PANEL
A/G RATIO: 1.6 (ref 1.2–2.2)
ALBUMIN: 4.6 g/dL (ref 3.5–5.5)
ALT: 12 IU/L (ref 0–32)
AST: 18 IU/L (ref 0–40)
Alkaline Phosphatase: 75 IU/L (ref 39–117)
BUN / CREAT RATIO: 5 — AB (ref 9–23)
BUN: 4 mg/dL — ABNORMAL LOW (ref 6–20)
Bilirubin Total: 3.1 mg/dL — ABNORMAL HIGH (ref 0.0–1.2)
CALCIUM: 9.1 mg/dL (ref 8.7–10.2)
CO2: 23 mmol/L (ref 20–29)
CREATININE: 0.75 mg/dL (ref 0.57–1.00)
Chloride: 104 mmol/L (ref 96–106)
GFR, EST AFRICAN AMERICAN: 132 mL/min/{1.73_m2} (ref >59)
GFR, EST NON AFRICAN AMERICAN: 114 mL/min/{1.73_m2} (ref >59)
GLOBULIN, TOTAL: 2.8 g/dL (ref 1.5–4.5)
Glucose: 79 mg/dL (ref 65–99)
POTASSIUM: 4.2 mmol/L (ref 3.5–5.2)
SODIUM: 142 mmol/L (ref 134–144)
TOTAL PROTEIN: 7.4 g/dL (ref 6.0–8.5)

## 2017-03-17 LAB — VITAMIN D 25 HYDROXY (VIT D DEFICIENCY, FRACTURES): Vit D, 25-Hydroxy: 12.6 ng/mL — ABNORMAL LOW (ref 30.0–100.0)

## 2017-03-17 NOTE — Telephone Encounter (Signed)
Called and left a message advising patient that vitamin D levels were decreased and that she needs to start vitamin D once weekly for 12 weeks, asked that she call and schedule a lab appointment for 12 weeks and keep next scheduled follow up. Thanks!

## 2017-03-17 NOTE — Telephone Encounter (Signed)
-----   Message from Massie MaroonLachina M Andrade, [place] sent at 03/17/2017 10:21 AM EDT ----- Regarding: lab results Inform patient that vitamin D level is decreased, which is consistent with vitamin d deficiency. Will start drisdol 50,000 IU weekly. Will recheck level in 12 weeks. All other labs are consistent with baseline. Please follow up as scheduled.     Tammy NationsLachina Moore Hollis  MSN, FNP-C Patient Care [facility] [address]  [place], [place] [address] [phone]

## 2017-03-18 LAB — URINE CULTURE

## 2017-06-09 ENCOUNTER — Other Ambulatory Visit: Payer: BLUE CROSS/BLUE SHIELD

## 2017-06-15 DIAGNOSIS — J039 Acute tonsillitis, unspecified: Secondary | ICD-10-CM | POA: Diagnosis not present

## 2017-06-23 ENCOUNTER — Other Ambulatory Visit: Payer: Self-pay | Admitting: Obstetrics and Gynecology

## 2017-06-23 DIAGNOSIS — Z308 Encounter for other contraceptive management: Secondary | ICD-10-CM

## 2017-07-24 ENCOUNTER — Other Ambulatory Visit: Payer: Self-pay | Admitting: Obstetrics

## 2017-07-24 DIAGNOSIS — Z308 Encounter for other contraceptive management: Secondary | ICD-10-CM

## 2017-09-18 ENCOUNTER — Ambulatory Visit: Payer: BLUE CROSS/BLUE SHIELD | Admitting: Family Medicine

## 2017-12-18 ENCOUNTER — Encounter: Payer: Self-pay | Admitting: Family Medicine

## 2017-12-18 ENCOUNTER — Ambulatory Visit (INDEPENDENT_AMBULATORY_CARE_PROVIDER_SITE_OTHER): Payer: Self-pay | Admitting: Family Medicine

## 2017-12-18 VITALS — BP 119/73 | HR 85 | Temp 99.1°F | Resp 16 | Ht 68.0 in | Wt 171.0 lb

## 2017-12-18 DIAGNOSIS — Z124 Encounter for screening for malignant neoplasm of cervix: Secondary | ICD-10-CM

## 2017-12-18 DIAGNOSIS — D5720 Sickle-cell/Hb-C disease without crisis: Secondary | ICD-10-CM

## 2017-12-18 DIAGNOSIS — R102 Pelvic and perineal pain: Secondary | ICD-10-CM

## 2017-12-18 LAB — POCT URINALYSIS DIPSTICK
Bilirubin, UA: NEGATIVE
Glucose, UA: NEGATIVE
Ketones, UA: NEGATIVE
Nitrite, UA: NEGATIVE
Protein, UA: NEGATIVE
Spec Grav, UA: 1.015 (ref 1.010–1.025)
Urobilinogen, UA: 1 E.U./dL
pH, UA: 7.5 (ref 5.0–8.0)

## 2017-12-18 LAB — POCT URINE PREGNANCY: Preg Test, Ur: NEGATIVE

## 2017-12-18 MED ORDER — METRONIDAZOLE 500 MG PO TABS: 500.0000 mg | ORAL_TABLET | Freq: Two times a day (BID) | ORAL | 0 refills | Status: AC

## 2017-12-18 MED ORDER — FLUCONAZOLE 150 MG PO TABS: 150.0000 mg | ORAL_TABLET | Freq: Once | ORAL | 0 refills | Status: AC

## 2017-12-18 NOTE — Patient Instructions (Signed)
Metronidazole tablets or capsules What is this medicine? METRONIDAZOLE (me troe NI da zole) is an antiinfective. It is used to treat certain kinds of bacterial and protozoal infections. It will not work for colds, flu, or other viral infections. This medicine may be used for other purposes; ask your health care provider or pharmacist if you have questions. COMMON BRAND NAME(S): Flagyl What should I tell my health care provider before I take this medicine? They need to know if you have any of these conditions: -anemia or other blood disorders -disease of the nervous system -fungal or yeast infection -if you drink alcohol containing drinks -liver disease -seizures -an unusual or allergic reaction to metronidazole, or other medicines, foods, dyes, or preservatives -pregnant or trying to get pregnant -breast-feeding How should I use this medicine? Take this medicine by mouth with a full glass of water. Follow the directions on the prescription label. Take your medicine at regular intervals. Do not take your medicine more often than directed. Take all of your medicine as directed even if you think you are better. Do not skip doses or stop your medicine early. Talk to your pediatrician regarding the use of this medicine in children. Special care may be needed. Overdosage: If you think you have taken too much of this medicine contact a poison control center or emergency room at once. NOTE: This medicine is only for you. Do not share this medicine with others. What if I miss a dose? If you miss a dose, take it as soon as you can. If it is almost time for your next dose, take only that dose. Do not take double or extra doses. What may interact with this medicine? Do not take this medicine with any of the following medications: -alcohol or any product that contains alcohol -amprenavir oral solution -cisapride -disulfiram -dofetilide -dronedarone -paclitaxel injection -pimozide -ritonavir oral  solution -sertraline oral solution -sulfamethoxazole-trimethoprim injection -thioridazine -ziprasidone This medicine may also interact with the following medications: -birth control pills -cimetidine -lithium -other medicines that prolong the QT interval (cause an abnormal heart rhythm) -phenobarbital -phenytoin -warfarin This list may not describe all possible interactions. Give your health care provider a list of all the medicines, herbs, non-prescription drugs, or dietary supplements you use. Also tell them if you smoke, drink alcohol, or use illegal drugs. Some items may interact with your medicine. What should I watch for while using this medicine? Tell your doctor or health care professional if your symptoms do not improve or if they get worse. You may get drowsy or dizzy. Do not drive, use machinery, or do anything that needs mental alertness until you know how this medicine affects you. Do not stand or sit up quickly, especially if you are an older patient. This reduces the risk of dizzy or fainting spells. Avoid alcoholic drinks while you are taking this medicine and for three days afterward. Alcohol may make you feel dizzy, sick, or flushed. If you are being treated for a sexually transmitted disease, avoid sexual contact until you have finished your treatment. Your sexual partner may also need treatment. What side effects may I notice from receiving this medicine? Side effects that you should report to your doctor or health care professional as soon as possible: -allergic reactions like skin rash or hives, swelling of the face, lips, or tongue -confusion, clumsiness -difficulty speaking -discolored or sore mouth -dizziness -fever, infection -numbness, tingling, pain or weakness in the hands or feet -trouble passing urine or change in the amount of   urine -redness, blistering, peeling or loosening of the skin, including inside the mouth -seizures -unusually weak or  tired -vaginal irritation, dryness, or discharge Side effects that usually do not require medical attention (report to your doctor or health care professional if they continue or are bothersome): -diarrhea -headache -irritability -metallic taste -nausea -stomach pain or cramps -trouble sleeping This list may not describe all possible side effects. Call your doctor for medical advice about side effects. You may report side effects to FDA at 1-800-FDA-1088. Where should I keep my medicine? Keep out of the reach of children. Store at room temperature below 25 degrees C (77 degrees F). Protect from light. Keep container tightly closed. Throw away any unused medicine after the expiration date. NOTE: This sheet is a summary. It may not cover all possible information. If you have questions about this medicine, talk to your doctor, pharmacist, or health care provider.  2018 Elsevier/Gold Standard (2012-07-27 14:08:39) Sickle Cell Anemia, Adult Sickle cell anemia is a condition where your red blood cells are shaped like sickles. Red blood cells carry oxygen through the body. Sickle-shaped red blood cells do not live as long as normal red blood cells. They also clump together and block blood from flowing through the blood vessels. These things prevent the body from getting enough oxygen. Sickle cell anemia causes organ damage and pain. It also increases the risk of infection. Follow these instructions at home:  Drink enough fluid to keep your pee (urine) clear or pale yellow. Drink more in hot weather and during exercise.  Do not smoke. Smoking lowers oxygen levels in the blood.  Only take over-the-counter or prescription medicines as told by your doctor.  Take antibiotic medicines as told by your doctor. Make sure you finish them even if you start to feel better.  Take supplements as told by your doctor.  Consider wearing a medical alert bracelet. This tells anyone caring for you in an  emergency of your condition.  When traveling, keep your medical information, doctors' names, and the medicines you take with you at all times.  If you have a fever, do not take fever medicines right away. This could cover up a problem. Tell your doctor.  Keep all follow-up visits with your doctor. Sickle cell anemia requires regular medical care. Contact a doctor if: You have a fever. Get help right away if:  You feel dizzy or faint.  You have new belly (abdominal) pain, especially on the left side near the stomach area.  You have a lasting, often uncomfortable and painful erection of the penis (priapism). If it is not treated right away, you will become unable to have sex (impotence).  You have numbness in your arms or legs or you have a hard time moving them.  You have a hard time talking.  You have a fever or lasting symptoms for more than 2-3 days.  You have a fever and your symptoms suddenly get worse.  You have signs or symptoms of infection. These include: ? Chills. ? Being more tired than normal (lethargy). ? Irritability. ? Poor eating. ? Throwing up (vomiting).  You have pain that is not helped with medicine.  You have shortness of breath.  You have pain in your chest.  You are coughing up pus-like or bloody mucus.  You have a stiff neck.  Your feet or hands swell or have pain.  Your belly looks bloated.  Your joints hurt. This information is not intended to replace advice given to you  by your health care provider. Make sure you discuss any questions you have with your health care provider. Document Released: 10/10/2012 Document Revised: 05/28/2015 Document Reviewed: 08/01/2012 Elsevier Interactive Patient Education  2017 Elsevier Inc. Bacterial Vaginosis Bacterial vaginosis is an infection of the vagina. It happens when too many germs (bacteria) grow in the vagina. This infection puts you at risk for infections from sex (STIs). Treating this infection  can lower your risk for some STIs. You should also treat this if you are pregnant. It can cause your baby to be born early. Follow these instructions at home: Medicines  Take over-the-counter and prescription medicines only as told by your doctor.  Take or use your antibiotic medicine as told by your doctor. Do not stop taking or using it even if you start to feel better. General instructions  If you your sexual partner is a woman, tell her that you have this infection. She needs to get treatment if she has symptoms. If you have a female partner, he does not need to be treated.  During treatment: ? Avoid sex. ? Do not douche. ? Avoid alcohol as told. ? Avoid breastfeeding as told.  Drink enough fluid to keep your pee (urine) clear or pale yellow.  Keep your vagina and butt (rectum) clean. ? Wash the area with warm water every day. ? Wipe from front to back after you use the toilet.  Keep all follow-up visits as told by your doctor. This is important. Preventing this condition  Do not douche.  Use only warm water to wash around your vagina.  Use protection when you have sex. This includes: ? Latex condoms. ? Dental dams.  Limit how many people you have sex with. It is best to only have sex with the same person (be monogamous).  Get tested for STIs. Have your partner get tested.  Wear underwear that is cotton or lined with cotton.  Avoid tight pants and pantyhose. This is most important in summer.  Do not use any products that have nicotine or tobacco in them. These include cigarettes and e-cigarettes. If you need help quitting, ask your doctor.  Do not use illegal drugs.  Limit how much alcohol you drink. Contact a doctor if:  Your symptoms do not get better, even after you are treated.  You have more discharge or pain when you pee (urinate).  You have a fever.  You have pain in your belly (abdomen).  You have pain with sex.  Your bleed from your vagina between  periods. Summary  This infection happens when too many germs (bacteria) grow in the vagina.  Treating this condition can lower your risk for some infections from sex (STIs).  You should also treat this if you are pregnant. It can cause early (premature) birth.  Do not stop taking or using your antibiotic medicine even if you start to feel better. This information is not intended to replace advice given to you by your health care provider. Make sure you discuss any questions you have with your health care provider. Document Released: 09/29/2007 Document Revised: 09/05/2015 Document Reviewed: 09/05/2015 Elsevier Interactive Patient Education  2017 [employer]

## 2017-12-18 NOTE — Progress Notes (Signed)
[facility] INTERNAL MEDICINE AND SICKLE CELL CARE  SICKLE CELL ANEMIA FOLLOW UP VISIT PROVIDER: Mike Gip, FNP    Subjective:   Tammy Andrade  is a 22 y.o.  female who  has a past medical history of Sickle cell anemia (HCC) (1997). presents for a follow up for Sickle Cell Anemia. Patient presents with pelvic pain x 1 week. Patient is currently sexually active with 2 female partners in the past 3 months with % condom use.  Patient states that she is having foul smelling discharge. Denies rashes to the vulva.  Patient's last menstrual period was 11/28/2017. She reports monthly periods. Has not had pap smear.   Patient states that she has a sickle cell agent that she is in touch with often. Transferring from pediatric to adult heme. Graduated from [employer] in May with a degree in Investment banker, corporate. Plans on going to law school in the fall.   The patient has had 0 admissions in the past 6 months.  Pain regimen includes: Ibuprofen Hydrea Therapy: No Medication compliance: Yes  Pain today is 3/10.  The patient reports adequate daily hydration.     Review of Systems  Constitutional: Negative.   HENT: Negative.   Eyes: Negative.   Respiratory: Negative.   Cardiovascular: Negative.   Gastrointestinal: Positive for abdominal pain.  Genitourinary: Negative for dysuria.       + vaginal discharge  Musculoskeletal: Negative.   Skin: Negative.   Neurological: Negative.   Psychiatric/Behavioral: Negative.     Objective:   Objective  BP 119/73 (BP Location: Left Arm, Patient Position: Sitting, Cuff Size: Normal)   Pulse 85   Temp 99.1 F (37.3 C) (Oral)   Resp 16   Ht 5\' 8"  (1.727 m)   Wt 171 lb (77.6 kg)   LMP 11/28/2017   SpO2 100%   BMI 26.00 kg/m   Wt Readings from Last 3 Encounters:  12/18/17 171 lb (77.6 kg)  03/16/17 164 lb (74.4 kg)  02/06/17 163 lb 12.8 oz (74.3 kg)     Physical Exam Exam conducted with a chaperone present.  Genitourinary:    General:  Normal vulva.     Exam position: Supine.     Pubic Area: No rash or pubic lice.      Vagina: Vaginal discharge present.     Cervix: Normal.     Uterus: Normal.      Adnexa:        Right: Tenderness present.      Rectum: Normal.         Assessment/Plan:   Assessment   Encounter Diagnoses  Name Primary?  . 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 Pelvic pain   . Pap smear for cervical cancer screening      Plan  1. Sickle cell-hemoglobin C disease without crisis (HCC) - Urinalysis Dipstick - CBC with Differential - Comprehensive metabolic panel  2. Pelvic pain - POCT urine pregnancy - Urine Culture - Pap IG, CT/NG w/ reflex HPV when [facility]-U ([facility]) - Vaginitis/Vaginosis, DNA Probe - metroNIDAZOLE (FLAGYL) 500 MG tablet; Take 1 tablet (500 mg total) by mouth 2 (two) times daily for 7 days.  Dispense: 14 tablet; Refill: 0 - fluconazole (DIFLUCAN) 150 MG tablet; Take 1 tablet (150 mg total) by mouth once for 1 dose.  Dispense: 1 tablet; Refill: 0  3. Pap smear for cervical cancer screening Pap smear obtained during today's visit. Results pending.  - Pap IG, CT/NG w/ reflex HPV  when [facility]    Return to care as scheduled and prn. Patient verbalized understanding and agreed with plan of care.   1. Sickle cell disease -  We discussed the need for good hydration, monitoring of hydration status, avoidance of heat, cold, stress, and infection triggers. We discussed the risks and benefits of Hydrea, including bone marrow suppression, the possibility of GI upset, skin ulcers, hair thinning, and teratogenicity. The patient was reminded of the need to seek medical attention of any symptoms of bleeding, anemia, or infection. Continue folic acid 1 mg daily to prevent aplastic bone marrow crises.   2. Pulmonary evaluation - Patient denies severe recurrent wheezes, shortness of breath with exercise, or persistent cough. If these symptoms develop, pulmonary  function tests with spirometry will be ordered, and if abnormal, plan on referral to Pulmonology for further evaluation.  3. Cardiac - Routine screening for pulmonary hypertension is not recommended.  4. Eye - High risk of proliferative retinopathy. Annual eye exam with retinal exam recommended to patient.  5. Immunization status -  Yearly influenza vaccination is recommended, as well as being up to date with Meningococcal and Pneumococcal vaccines.   6. Acute and chronic painful episodes - We discussed that pt is to receive Schedule II prescriptions only from us. Pt is also aware that the prescription history is available to us online through the [facility] CS[facility]. Controlled substance agreement signed. We reminded Grandville SilosKiah Andrade that all patients receiving Schedule II narcotics must be seen for follow within one month of prescription being requested. We reviewed the terms of our pain agreement, including the need to keep medicines in a safe locked location away from children or pets, and the need to report excess sedation or constipation, measures to avoid constipation, and policies related to early refills and stolen prescriptions. According to the [place] [facility] program, we have reviewed details related to analgesia, adverse effects, aberrant behaviors.  7. Iron overload from chronic transfusion.  Not applicable at this time.  If this occurs will use Exjade for management.   8. Vitamin D deficiency - Drisdol 50,000 units weekly. Patient encouraged to take as prescribed.   The above recommendations are taken from the NIH Evidence-Based Management of Sickle Cell Disease: Expert Panel Report, [id number].   Ms. Tammy L. Riley Lamouglas, FNP-BC [facility] [address]  [place], [place] [id number] [phone]  This note has been created with Dragon speech recognition software and smart phrase technology. Any transcriptional errors are unintentional.

## 2017-12-19 LAB — CBC WITH DIFFERENTIAL/PLATELET
Basophils Absolute: 0.1 10*3/uL (ref 0.0–0.2)
Basos: 1 %
EOS (ABSOLUTE): 0.3 10*3/uL (ref 0.0–0.4)
Eos: 3 %
Hematocrit: 33 % — ABNORMAL LOW (ref 34.0–46.6)
Hemoglobin: 10.8 g/dL — ABNORMAL LOW (ref 11.1–15.9)
Immature Grans (Abs): 0 10*3/uL (ref 0.0–0.1)
Immature Granulocytes: 0 %
Lymphocytes Absolute: 4.1 10*3/uL — ABNORMAL HIGH (ref 0.7–3.1)
Lymphs: 30 %
MCH: 28.3 pg (ref 26.6–33.0)
MCHC: 32.7 g/dL (ref 31.5–35.7)
MCV: 86 fL (ref 79–97)
Monocytes Absolute: 1.2 10*3/uL — ABNORMAL HIGH (ref 0.1–0.9)
Monocytes: 9 %
NRBC: 1 % — ABNORMAL HIGH (ref 0–0)
Neutrophils Absolute: 7.9 10*3/uL — ABNORMAL HIGH (ref 1.4–7.0)
Neutrophils: 57 %
Platelets: 400 10*3/uL (ref 150–450)
RBC: 3.82 x10E6/uL (ref 3.77–5.28)
RDW: 15.1 % (ref 12.3–15.4)
WBC: 13.8 10*3/uL — ABNORMAL HIGH (ref 3.4–10.8)

## 2017-12-19 LAB — COMPREHENSIVE METABOLIC PANEL
ALT: 11 IU/L (ref 0–32)
AST: 14 IU/L (ref 0–40)
Albumin/Globulin Ratio: 1.6 (ref 1.2–2.2)
Albumin: 4.5 g/dL (ref 3.5–5.5)
Alkaline Phosphatase: 61 IU/L (ref 39–117)
BUN/Creatinine Ratio: 8 — ABNORMAL LOW (ref 9–23)
BUN: 6 mg/dL (ref 6–20)
Bilirubin Total: 2.7 mg/dL — ABNORMAL HIGH (ref 0.0–1.2)
CO2: 23 mmol/L (ref 20–29)
Calcium: 9.5 mg/dL (ref 8.7–10.2)
Chloride: 105 mmol/L (ref 96–106)
Creatinine, Ser: 0.76 mg/dL (ref 0.57–1.00)
GFR calc Af Amer: 129 mL/min/{1.73_m2} (ref >59)
GFR calc non Af Amer: 112 mL/min/{1.73_m2} (ref >59)
Globulin, Total: 2.9 g/dL (ref 1.5–4.5)
Glucose: 81 mg/dL (ref 65–99)
Potassium: 4.2 mmol/L (ref 3.5–5.2)
Sodium: 140 mmol/L (ref 134–144)
Total Protein: 7.4 g/dL (ref 6.0–8.5)

## 2017-12-20 LAB — URINE CULTURE

## 2017-12-20 LAB — VAGINITIS/VAGINOSIS, DNA PROBE
Candida Species: NEGATIVE
Gardnerella vaginalis: POSITIVE — AB
Trichomonas vaginosis: NEGATIVE

## 2017-12-21 ENCOUNTER — Telehealth: Payer: Self-pay

## 2017-12-21 MED ORDER — SULFAMETHOXAZOLE-TRIMETHOPRIM 800-160 MG PO TABS: 1.0000 | ORAL_TABLET | Freq: Two times a day (BID) | ORAL | 0 refills | Status: AC

## 2017-12-21 NOTE — Telephone Encounter (Signed)
Called and spoke with patient. Advised that she was positive for BV and UTI. Advised that she already has the flagyl for the BV but we are sending in an rx for Bactrim for UTI for 3 days. Patient verbalized understanding. Thanks!

## 2017-12-21 NOTE — Telephone Encounter (Signed)
-----   Message from Mike GipAndre Douglas, FNP sent at 12/21/2017  4:00 PM EST ----- Please let patient know that she was positive for bacterial vaginitis and a urinary tract infection. She should already have the flagyl for the BV. I will send in a 3 days of another antibiotic (Bactrim) for the UTI.

## 2017-12-22 LAB — PAP IG, CT-NG, RFX HPV ASCU
Chlamydia, Nuc. Acid Amp: NEGATIVE
Gonococcus by Nucleic Acid Amp: NEGATIVE

## 2018-03-22 ENCOUNTER — Encounter: Payer: Self-pay | Admitting: Family Medicine

## 2018-03-22 ENCOUNTER — Ambulatory Visit (INDEPENDENT_AMBULATORY_CARE_PROVIDER_SITE_OTHER): Payer: Self-pay | Admitting: Family Medicine

## 2018-03-22 ENCOUNTER — Other Ambulatory Visit: Payer: Self-pay

## 2018-03-22 VITALS — BP 122/71 | HR 95 | Temp 98.1°F | Resp 16 | Ht 68.0 in | Wt 169.0 lb

## 2018-03-22 DIAGNOSIS — J301 Allergic rhinitis due to pollen: Secondary | ICD-10-CM

## 2018-03-22 DIAGNOSIS — D5720 Sickle-cell/Hb-C disease without crisis: Secondary | ICD-10-CM | POA: Insufficient documentation

## 2018-03-22 NOTE — Patient Instructions (Signed)
Allergic Rhinitis, Adult Allergic rhinitis is an allergic reaction that affects the mucous membrane inside the nose. It causes sneezing, a runny or stuffy nose, and the feeling of mucus going down the back of the throat (postnasal drip). Allergic rhinitis can be mild to severe. There are two types of allergic rhinitis:  Seasonal. This type is also called hay fever. It happens only during certain seasons.  Perennial. This type can happen at any time of the year. What are the causes? This condition happens when the body's defense system (immune system) responds to certain harmless substances called allergens as though they were germs.  Seasonal allergic rhinitis is triggered by pollen, which can come from grasses, trees, and weeds. Perennial allergic rhinitis may be caused by:  House dust mites.  Pet dander.  Mold spores. What are the signs or symptoms? Symptoms of this condition include:  Sneezing.  Runny or stuffy nose (nasal congestion).  Postnasal drip.  Itchy nose.  Tearing of the eyes.  Trouble sleeping.  Daytime sleepiness. How is this diagnosed? This condition may be diagnosed based on:  Your medical history.  A physical exam.  Tests to check for related conditions, such as: ? Asthma. ? Pink eye. ? Ear infection. ? Upper respiratory infection.  Tests to find out which allergens trigger your symptoms. These may include skin or blood tests. How is this treated? There is no cure for this condition, but treatment can help control symptoms. Treatment may include:  Taking medicines that block allergy symptoms, such as antihistamines. Medicine may be given as a shot, nasal spray, or pill.  Avoiding the allergen.  Desensitization. This treatment involves getting ongoing shots until your body becomes less sensitive to the allergen. This treatment may be done if other treatments do not help.  If taking medicine and avoiding the allergen does not work, new, stronger  medicines may be prescribed. Follow these instructions at home:  Find out what you are allergic to. Common allergens include smoke, dust, and pollen.  Avoid the things you are allergic to. These are some things you can do to help avoid allergens: ? Replace carpet with wood, tile, or vinyl flooring. Carpet can trap dander and dust. ? Do not smoke. Do not allow smoking in your home. ? Change your heating and air conditioning filter at least once a month. ? During allergy season:  Keep windows closed as much as possible.  Plan outdoor activities when pollen counts are lowest. This is usually during the evening hours.  When coming indoors, change clothing and shower before sitting on furniture or bedding.  Take over-the-counter and prescription medicines only as told by your health care provider.  Keep all follow-up visits as told by your health care provider. This is important. Contact a health care provider if:  You have a fever.  You develop a persistent cough.  You make whistling sounds when you breathe (you wheeze).  Your symptoms interfere with your normal daily activities. Get help right away if:  You have shortness of breath. Summary  This condition can be managed by taking medicines as directed and avoiding allergens.  Contact your health care provider if you develop a persistent cough or fever.  During allergy season, keep windows closed as much as possible. This information is not intended to replace advice given to you by your health care provider. Make sure you discuss any questions you have with your health care provider. Document Released: 09/14/2000 Document Revised: 01/28/2016 Document Reviewed: 01/28/2016 Elsevier Interactive  Patient Education  2019 Elsevier Inc. Sickle Cell Anemia, Adult Sickle cell anemia is a condition where your red blood cells are shaped like sickles. Red blood cells carry oxygen through the body. Sickle-shaped cells do not live as long  as normal red blood cells. They also clump together and block blood from flowing through the blood vessels. This prevents the body from getting enough oxygen. Sickle cell anemia causes organ damage and pain. It also increases the risk of infection. Follow these instructions at home: Medicines  Take over-the-counter and prescription medicines only as told by your doctor.  If you were prescribed an antibiotic medicine, take it as told by your doctor. Do not stop taking the antibiotic even if you start to feel better.  If you develop a fever, do not take medicines to lower the fever right away. Tell your doctor about the fever. Managing pain, stiffness, and swelling  Try these methods to help with pain: ? Use a heating pad. ? Take a warm bath. ? Distract yourself, such as by watching TV. Eating and drinking  Drink enough fluid to keep your pee (urine) clear or pale yellow. Drink more in hot weather and during exercise.  Limit or avoid alcohol.  Eat a healthy diet. Eat plenty of fruits, vegetables, whole grains, and lean protein.  Take vitamins and supplements as told by your doctor. Traveling  When traveling, keep these with you: ? Your medical information. ? The names of your doctors. ? Your medicines.  If you need to take an airplane, talk to your doctor first. Activity  Rest often.  Avoid exercises that make your heart beat much faster, such as jogging. General instructions  Do not use products that have nicotine or tobacco, such as cigarettes and e-cigarettes. If you need help quitting, ask your doctor.  Consider wearing a medical alert bracelet.  Avoid being in high places (high altitudes), such as mountains.  Avoid very hot or cold temperatures.  Avoid places where the temperature changes a lot.  Keep all follow-up visits as told by your doctor. This is important. Contact a doctor if:  A joint hurts.  Your feet or hands hurt or swell.  You feel tired  (fatigued). Get help right away if:  You have symptoms of infection. These include: ? Fever. ? Chills. ? Being very tired. ? Irritability. ? Poor eating. ? Throwing up (vomiting).  You feel dizzy or faint.  You have new stomach pain, especially on the left side.  You have a an erection (priapism) that lasts more than 4 hours.  You have numbness in your arms or legs.  You have a hard time moving your arms or legs.  You have trouble talking.  You have pain that does not go away when you take medicine.  You are short of breath.  You are breathing fast.  You have a long-term cough.  You have pain in your chest.  You have a bad headache.  You have a stiff neck.  Your stomach looks bloated even though you did not eat much.  Your skin is pale.  You suddenly cannot see well. Summary  Sickle cell anemia is a condition where your red blood cells are shaped like sickles.  Follow your doctor's advice on ways to manage pain, food to eat, activities to do, and steps to take for safe travel.  Get medical help right away if you have any signs of infection, such as a fever. This information is not intended to  replace advice given to you by your health care provider. Make sure you discuss any questions you have with your health care provider. Document Released: 10/10/2012 Document Revised: 01/26/2016 Document Reviewed: 01/26/2016 Elsevier Interactive Patient Education  2019 [employer].

## 2018-03-22 NOTE — Progress Notes (Signed)
[facility] INTERNAL MEDICINE AND SICKLE CELL CARE  SICKLE CELL ANEMIA FOLLOW UP VISIT PROVIDER: Mike Gip, FNP    Subjective:   Tammy Andrade  is a 23 y.o.  female who  has a past medical history of Sickle cell anemia (HCC) (1997). presents for a follow up for Sickle Cell Anemia. Patient presents with a history of seasonal allergies. She is not currently taking flonase of zyrtec.     Patient states that she has a sickle cell agent that she is in touch with often. Transferring from pediatric to adult heme. Graduated from [employer] in May with a degree in Investment banker, corporate. Plans on going to law school in the fall.   The patient has had 0 admissions in the past 6 months.  Pain regimen includes: Ibuprofen Hydrea Therapy: No Medication compliance: Yes  Pain today is 3/10.  The patient reports adequate daily hydration.    ROS: positive for - itchy/watery eyes, nasal congestion, postnasal drip and seasonal allergies  Denies fever chills night sweats or cough.  Objective:   Objective  BP 122/71 (BP Location: Right Arm, Patient Position: Sitting, Cuff Size: Normal)   Pulse 95   Temp 98.1 F (36.7 C) (Oral)   Resp 16   Ht 5\' 8"  (1.727 m)   Wt 169 lb (76.7 kg)   SpO2 98%   BMI 25.70 kg/m   Wt Readings from Last 3 Encounters:  03/22/18 169 lb (76.7 kg)  12/18/17 171 lb (77.6 kg)  03/16/17 164 lb (74.4 kg)    Physical Exam  Constitutional: She is oriented to person, place, and time and well-developed, well-nourished, and in no distress. No distress.  HENT:  Head: Normocephalic and atraumatic.  Nose: Mucosal edema and rhinorrhea present. Right sinus exhibits no maxillary sinus tenderness and no frontal sinus tenderness. Left sinus exhibits no maxillary sinus tenderness and no frontal sinus tenderness.  Mouth/Throat: Uvula is midline, oropharynx is clear and moist and mucous membranes are normal.    Eyes: Pupils are equal, round, and reactive to light. Conjunctivae and EOM  are normal.  Neck: Normal range of motion. Neck supple.  Cardiovascular: Normal rate, regular rhythm and intact distal pulses. Exam reveals no gallop and no friction rub.  No murmur heard. Pulmonary/Chest: Effort normal and breath sounds normal. No respiratory distress. She has no wheezes.  Abdominal: Soft. Bowel sounds are normal. There is no abdominal tenderness.  Musculoskeletal: Normal range of motion.        General: No tenderness or edema.  Lymphadenopathy:    She has no cervical adenopathy.  Neurological: She is alert and oriented to person, place, and time. Gait normal.  Skin: Skin is warm and dry.  Psychiatric: Mood, memory, affect and judgment normal.  Nursing note and vitals reviewed.   Assessment/Plan:   1. Sickle cell-hemoglobin C disease without crisis (HCC) - CBC with Differential - Comprehensive metabolic panel  2. Non-seasonal allergic rhinitis due to pollen Re-start flonase, zyrtec and sinus lavage.   Return to care as scheduled and prn. Patient verbalized understanding and agreed with plan of care.   1. Sickle cell disease -  We discussed the need for good hydration, monitoring of hydration status, avoidance of heat, cold, stress, and infection triggers. We discussed the risks and benefits of Hydrea, including bone marrow suppression, the possibility of GI upset, skin ulcers, hair thinning, and teratogenicity. The patient was reminded of the need to seek medical attention of any symptoms of bleeding, anemia, or infection. Continue folic  acid 1 mg daily to prevent aplastic bone marrow crises.   2. Pulmonary evaluation - Patient denies severe recurrent wheezes, shortness of breath with exercise, or persistent cough. If these symptoms develop, pulmonary function tests with spirometry will be ordered, and if abnormal, plan on referral to Pulmonology for further evaluation.  3. Cardiac - Routine screening for pulmonary hypertension is not recommended.  4. Eye - High  risk of proliferative retinopathy. Annual eye exam with retinal exam recommended to patient.  5. Immunization status -  Yearly influenza vaccination is recommended, as well as being up to date with Meningococcal and Pneumococcal vaccines.   6. Acute and chronic painful episodes - We discussed that pt is to receive Schedule II prescriptions only from [place]. Pt is also aware that the prescription history is available to [place] online through the [facility] CSRS. Controlled substance agreement signed. We reminded Venie Gunnell that all patients receiving Schedule II narcotics must be seen for follow within one month of prescription being requested. We reviewed the terms of our pain agreement, including the need to keep medicines in a safe locked location away from children or pets, and the need to report excess sedation or constipation, measures to avoid constipation, and policies related to early refills and stolen prescriptions. According to the [place] Chronic Pain Initiative program, we have reviewed details related to analgesia, adverse effects, aberrant behaviors.  7. Iron overload from chronic transfusion.  Not applicable at this time.  If this occurs will use Exjade for management.   8. Vitamin D deficiency - Drisdol 50,000 units weekly. Patient encouraged to take as prescribed.   The above recommendations are taken from the NIH Evidence-Based Management of Sickle Cell Disease: Expert Panel Report, 60109.   Ms. Andr L. Riley Lam, FNP-BC [facility] [address] [place], [place] [address] [phone]  This note has been created with Dragon speech recognition software and smart phrase technology. Any transcriptional errors are unintentional.

## 2018-03-23 LAB — COMPREHENSIVE METABOLIC PANEL
ALT: 23 IU/L (ref 0–32)
AST: 24 IU/L (ref 0–40)
Albumin/Globulin Ratio: 1.8 (ref 1.2–2.2)
Albumin: 4.6 g/dL (ref 3.9–5.0)
Alkaline Phosphatase: 69 IU/L (ref 39–117)
BUN/Creatinine Ratio: 7 — ABNORMAL LOW (ref 9–23)
BUN: 5 mg/dL — ABNORMAL LOW (ref 6–20)
Bilirubin Total: 2.9 mg/dL — ABNORMAL HIGH (ref 0.0–1.2)
CO2: 24 mmol/L (ref 20–29)
Calcium: 9.3 mg/dL (ref 8.7–10.2)
Chloride: 99 mmol/L (ref 96–106)
Creatinine, Ser: 0.73 mg/dL (ref 0.57–1.00)
GFR calc Af Amer: 135 mL/min/{1.73_m2} (ref >59)
GFR calc non Af Amer: 117 mL/min/{1.73_m2} (ref >59)
Globulin, Total: 2.5 g/dL (ref 1.5–4.5)
Glucose: 78 mg/dL (ref 65–99)
Potassium: 3.9 mmol/L (ref 3.5–5.2)
Sodium: 137 mmol/L (ref 134–144)
Total Protein: 7.1 g/dL (ref 6.0–8.5)

## 2018-03-23 LAB — CBC WITH DIFFERENTIAL/PLATELET
Basophils Absolute: 0.1 10*3/uL (ref 0.0–0.2)
Basos: 1 %
EOS (ABSOLUTE): 0.1 10*3/uL (ref 0.0–0.4)
Eos: 1 %
Hematocrit: 35.5 % (ref 34.0–46.6)
Hemoglobin: 11.4 g/dL (ref 11.1–15.9)
Immature Grans (Abs): 0 10*3/uL (ref 0.0–0.1)
Immature Granulocytes: 0 %
Lymphocytes Absolute: 3.6 10*3/uL — ABNORMAL HIGH (ref 0.7–3.1)
Lymphs: 20 %
MCH: 28.3 pg (ref 26.6–33.0)
MCHC: 32.1 g/dL (ref 31.5–35.7)
MCV: 88 fL (ref 79–97)
Monocytes Absolute: 2.1 10*3/uL — ABNORMAL HIGH (ref 0.1–0.9)
Monocytes: 12 %
Neutrophils Absolute: 11.8 10*3/uL — ABNORMAL HIGH (ref 1.4–7.0)
Neutrophils: 66 %
Platelets: 339 10*3/uL (ref 150–450)
RBC: 4.03 x10E6/uL (ref 3.77–5.28)
RDW: 15.6 % — ABNORMAL HIGH (ref 11.7–15.4)
WBC: 17.8 10*3/uL — ABNORMAL HIGH (ref 3.4–10.8)

## 2018-03-30 ENCOUNTER — Encounter: Payer: Self-pay | Admitting: Family Medicine

## 2018-03-30 NOTE — Progress Notes (Signed)
Please print this lab letter and send to [employer]

## 2018-06-21 ENCOUNTER — Telehealth: Payer: Self-pay

## 2018-06-21 NOTE — Telephone Encounter (Signed)
Called and spoke with patient for COVID 19 Screening. Patient had no risk factors and is cleared to come into office for appointment. Thanks! 

## 2018-06-22 ENCOUNTER — Other Ambulatory Visit: Payer: Self-pay

## 2018-06-22 ENCOUNTER — Ambulatory Visit (INDEPENDENT_AMBULATORY_CARE_PROVIDER_SITE_OTHER): Payer: Self-pay | Admitting: Family Medicine

## 2018-06-22 ENCOUNTER — Encounter: Payer: Self-pay | Admitting: Family Medicine

## 2018-06-22 VITALS — BP 132/79 | HR 86 | Temp 98.7°F | Resp 14 | Ht 68.0 in | Wt 174.0 lb

## 2018-06-22 DIAGNOSIS — Z113 Encounter for screening for infections with a predominantly sexual mode of transmission: Secondary | ICD-10-CM

## 2018-06-22 DIAGNOSIS — D5720 Sickle-cell/Hb-C disease without crisis: Secondary | ICD-10-CM

## 2018-06-22 LAB — POCT URINALYSIS DIPSTICK
Bilirubin, UA: NEGATIVE
Glucose, UA: NEGATIVE
Ketones, UA: NEGATIVE
Nitrite, UA: NEGATIVE
Protein, UA: NEGATIVE
Spec Grav, UA: 1.02 (ref 1.010–1.025)
Urobilinogen, UA: 0.2 E.U./dL
pH, UA: 7.5 (ref 5.0–8.0)

## 2018-06-22 NOTE — Patient Instructions (Signed)
Sickle Cell Anemia, Adult °Sickle cell anemia is a condition where your red blood cells are shaped like sickles. Red blood cells carry oxygen through the body. Sickle-shaped cells do not live as long as normal red blood cells. They also clump together and block blood from flowing through the blood vessels. This prevents the body from getting enough oxygen. Sickle cell anemia causes organ damage and pain. It also increases the risk of infection. °Follow these instructions at home: °Medicines °· Take over-the-counter and prescription medicines only as told by your doctor. °· If you were prescribed an antibiotic medicine, take it as told by your doctor. Do not stop taking the antibiotic even if you start to feel better. °· If you develop a fever, do not take medicines to lower the fever right away. Tell your doctor about the fever. °Managing pain, stiffness, and swelling °· Try these methods to help with pain: °? Use a heating pad. °? Take a warm bath. °? Distract yourself, such as by watching TV. °Eating and drinking °· Drink enough fluid to keep your pee (urine) clear or pale yellow. Drink more in hot weather and during exercise. °· Limit or avoid alcohol. °· Eat a healthy diet. Eat plenty of fruits, vegetables, whole grains, and lean protein. °· Take vitamins and supplements as told by your doctor. °Traveling °· When traveling, keep these with you: °? Your medical information. °? The names of your doctors. °? Your medicines. °· If you need to take an airplane, talk to your doctor first. °Activity °· Rest often. °· Avoid exercises that make your heart beat much faster, such as jogging. °General instructions °· Do not use products that have nicotine or tobacco, such as cigarettes and e-cigarettes. If you need help quitting, ask your doctor. °· Consider wearing a medical alert bracelet. °· Avoid being in high places (high altitudes), such as mountains. °· Avoid very hot or cold temperatures. °· Avoid places where the  temperature changes a lot. °· Keep all follow-up visits as told by your doctor. This is important. °Contact a doctor if: °· A joint hurts. °· Your feet or hands hurt or swell. °· You feel tired (fatigued). °Get help right away if: °· You have symptoms of infection. These include: °? Fever. °? Chills. °? Being very tired. °? Irritability. °? Poor eating. °? Throwing up (vomiting). °· You feel dizzy or faint. °· You have new stomach pain, especially on the left side. °· You have a an erection (priapism) that lasts more than 4 hours. °· You have numbness in your arms or legs. °· You have a hard time moving your arms or legs. °· You have trouble talking. °· You have pain that does not go away when you take medicine. °· You are short of breath. °· You are breathing fast. °· You have a long-term cough. °· You have pain in your chest. °· You have a bad headache. °· You have a stiff neck. °· Your stomach looks bloated even though you did not eat much. °· Your skin is pale. °· You suddenly cannot see well. °Summary °· Sickle cell anemia is a condition where your red blood cells are shaped like sickles. °· Follow your doctor's advice on ways to manage pain, food to eat, activities to do, and steps to take for safe travel. °· Get medical help right away if you have any signs of infection, such as a fever. °This information is not intended to replace advice given to you by your   health care provider. Make sure you discuss any questions you have with your health care provider. °Document Released: 10/10/2012 Document Revised: 01/26/2016 Document Reviewed: 01/26/2016 °Elsevier Interactive Patient Education © 2019 Elsevier Inc. ° °

## 2018-06-22 NOTE — Progress Notes (Signed)
PATIENT [facility]ARE CENTER INTERNAL MEDICINE AND SICKLE CELL CARE  SICKLE CELL ANEMIA FOLLOW UP VISIT PROVIDER: Mike GipAndre Tecla Mailloux, FNP    Subjective:   Tammy Andrade  is a 23 y.o.  female who  has a past medical history of Sickle cell anemia (HCC) (1997). presents for a follow up for Sickle Cell Anemia. The patient has had 0 admissions in the past 6 months.  Pain regimen includes: Ibuprofen Hydrea Therapy: No Medication compliance: Yes  Patient states that she hit her left knee a counter approx 1 week ago. She states that she has been taking ibuprofen and icing the area with relief.  Health maintenance due: TDAP (declines) CHL/GC screening.  Review of Systems  Constitutional: Negative.   HENT: Negative.   Eyes: Negative.   Respiratory: Negative.   Cardiovascular: Negative.   Gastrointestinal: Negative.   Genitourinary: Negative.   Musculoskeletal: Positive for joint pain (Left knee).  Skin: Negative.   Neurological: Negative.   Psychiatric/Behavioral: Negative.     Objective:   Objective  BP 132/79 (BP Location: Left Arm, Patient Position: Sitting, Cuff Size: Normal)    Pulse 86    Temp 98.7 F (37.1 C) (Oral)    Resp 14    Ht 5\' 8"  (1.727 m)    Wt 174 lb (78.9 kg)    LMP 06/18/2018    SpO2 100%    BMI 26.46 kg/m   Wt Readings from Last 3 Encounters:  06/22/18 174 lb (78.9 kg)  03/22/18 169 lb (76.7 kg)  12/18/17 171 lb (77.6 kg)     Physical Exam Vitals signs and nursing note reviewed.  Constitutional:      General: She is not in acute distress.    Appearance: Normal appearance.  HENT:     Head: Normocephalic and atraumatic.  Eyes:     Extraocular Movements: Extraocular movements intact.     Conjunctiva/sclera: Conjunctivae normal.     Pupils: Pupils are equal, round, and reactive to light.  Cardiovascular:     Rate and Rhythm: Normal rate and regular rhythm.     Heart sounds: No murmur.  Pulmonary:     Effort: Pulmonary effort is normal.     Breath sounds: Normal  breath sounds.  Musculoskeletal: Normal range of motion.     Left knee: Tenderness found. Lateral joint line (mild) tenderness noted.  Skin:    General: Skin is warm and dry.  Neurological:     Mental Status: She is alert and oriented to person, place, and time.  Psychiatric:        Mood and Affect: Mood normal.        Behavior: Behavior normal.        Thought Content: Thought content normal.        Judgment: Judgment normal.      Assessment/Plan:   Assessment   Encounter Diagnoses  Name Primary?   Sickle cell-hemoglobin C disease without crisis (HCC) Yes   Screen for STD (sexually transmitted disease)      Plan  1. Sickle cell-hemoglobin C disease without crisis (HCC) Labs ordered today - Urinalysis Dipstick - CBC With Differential - Comprehensive metabolic panel - VITAMIN D 25 Hydroxy (Vit-D Deficiency, Fractures) - Ferritin  2. Screen for STD (sexually transmitted disease) Denies symptoms at the present time.  - Chlamydia/GC NAA, Confirmation   Patient reports that [place] [employer] and that this will be her last visit with this clinic. [facility]  Return to care as scheduled and prn. Patient verbalized understanding and agreed with plan of care.   1. Sickle cell disease -  We discussed the need for good hydration, monitoring of hydration status, avoidance of heat, cold, stress, and infection triggers. We discussed the risks and benefits of Hydrea, including bone marrow suppression, the possibility of GI upset, skin ulcers, hair thinning, and teratogenicity. The patient was reminded of the need to seek medical attention of any symptoms of bleeding, anemia, or infection. Continue folic acid 1 mg daily to prevent aplastic bone marrow crises.   2. Pulmonary evaluation - Patient denies severe recurrent wheezes, shortness of breath with exercise, or persistent cough. If these symptoms develop, pulmonary  function tests with spirometry will be ordered, and if abnormal, plan on referral to Pulmonology for further evaluation.  3. Cardiac - Routine screening for pulmonary hypertension is not recommended.  4. Eye - High risk of proliferative retinopathy. Annual eye exam with retinal exam recommended to patient.  5. Immunization status -  Yearly influenza vaccination is recommended, as well as being up to date with Meningococcal and Pneumococcal vaccines.   6. Acute and chronic painful episodes - We discussed that pt is to receive Schedule II prescriptions only from [place]. Pt is also aware that the prescription history is available to [place] online through the [facility] CSRS. Controlled substance agreement signed. We reminded Yi Falletta that all patients receiving Schedule II narcotics must be seen for follow within one month of prescription being requested. We reviewed the terms of our pain agreement, including the need to keep medicines in a safe locked location away from children or pets, and the need to report excess sedation or constipation, measures to avoid constipation, and policies related to early refills and stolen prescriptions. According to the [place] Chronic Pain Initiative program, we have reviewed details related to analgesia, adverse effects, aberrant behaviors.  7. Iron overload from chronic transfusion.  Not applicable at this time.  If this occurs will use Exjade for management.   8. Vitamin D deficiency - Drisdol 50,000 units weekly. Patient encouraged to take as prescribed.   The above recommendations are taken from the NIH Evidence-Based Management of Sickle Cell Disease: Expert Panel Report, 20149.   Ms. Andr L. Nathaneil Canary, FNP-BC [facility] [address] [place], [place] [address] [phone]  This note has been created with Dragon speech recognition software and smart phrase technology. Any transcriptional errors are unintentional.

## 2018-06-23 LAB — CBC WITH DIFFERENTIAL
Basophils Absolute: 0.1 10*3/uL (ref 0.0–0.2)
Basos: 1 %
EOS (ABSOLUTE): 0.2 10*3/uL (ref 0.0–0.4)
Eos: 1 %
Hematocrit: 32.7 % — ABNORMAL LOW (ref 34.0–46.6)
Hemoglobin: 10.9 g/dL — ABNORMAL LOW (ref 11.1–15.9)
Immature Grans (Abs): 0 10*3/uL (ref 0.0–0.1)
Immature Granulocytes: 0 %
Lymphocytes Absolute: 3.2 10*3/uL — ABNORMAL HIGH (ref 0.7–3.1)
Lymphs: 21 %
MCH: 28.2 pg (ref 26.6–33.0)
MCHC: 33.3 g/dL (ref 31.5–35.7)
MCV: 85 fL (ref 79–97)
Monocytes Absolute: 1.2 10*3/uL — ABNORMAL HIGH (ref 0.1–0.9)
Monocytes: 7 %
NRBC: 1 % — ABNORMAL HIGH (ref 0–0)
Neutrophils Absolute: 10.9 10*3/uL — ABNORMAL HIGH (ref 1.4–7.0)
Neutrophils: 70 %
RBC: 3.86 x10E6/uL (ref 3.77–5.28)
RDW: 15.4 % (ref 11.7–15.4)
WBC: 15.7 10*3/uL — ABNORMAL HIGH (ref 3.4–10.8)

## 2018-06-23 LAB — COMPREHENSIVE METABOLIC PANEL
ALT: 12 IU/L (ref 0–32)
AST: 20 IU/L (ref 0–40)
Albumin/Globulin Ratio: 1.5 (ref 1.2–2.2)
Albumin: 4.5 g/dL (ref 3.9–5.0)
Alkaline Phosphatase: 68 IU/L (ref 39–117)
BUN/Creatinine Ratio: 10 (ref 9–23)
BUN: 7 mg/dL (ref 6–20)
Bilirubin Total: 2.2 mg/dL — ABNORMAL HIGH (ref 0.0–1.2)
CO2: 23 mmol/L (ref 20–29)
Calcium: 9.9 mg/dL (ref 8.7–10.2)
Chloride: 102 mmol/L (ref 96–106)
Creatinine, Ser: 0.67 mg/dL (ref 0.57–1.00)
GFR calc Af Amer: 144 mL/min/{1.73_m2} (ref >59)
GFR calc non Af Amer: 125 mL/min/{1.73_m2} (ref >59)
Globulin, Total: 3 g/dL (ref 1.5–4.5)
Glucose: 95 mg/dL (ref 65–99)
Potassium: 4.2 mmol/L (ref 3.5–5.2)
Sodium: 139 mmol/L (ref 134–144)
Total Protein: 7.5 g/dL (ref 6.0–8.5)

## 2018-06-23 LAB — FERRITIN: Ferritin: 183 ng/mL — ABNORMAL HIGH (ref 15–150)

## 2018-06-23 LAB — VITAMIN D 25 HYDROXY (VIT D DEFICIENCY, FRACTURES): Vit D, 25-Hydroxy: 15.7 ng/mL — ABNORMAL LOW (ref 30.0–100.0)

## 2018-06-26 LAB — CHLAMYDIA/GC NAA, CONFIRMATION
Chlamydia trachomatis, NAA: NEGATIVE
Neisseria gonorrhoeae, NAA: NEGATIVE

## 2018-07-02 NOTE — Progress Notes (Signed)
Your vitamin D is low. I will send a prescription for weekly vitamin D to the pharmacy. We will continue to monitor your vitamin D levels.

## 2018-07-03 ENCOUNTER — Telehealth: Payer: Self-pay

## 2018-07-03 NOTE — Telephone Encounter (Signed)
Called, no answer left a message to call back. Thanks!  

## 2018-07-03 NOTE — Telephone Encounter (Signed)
-----   Message from Lanae Boast, [place] sent at 07/02/2018  6:09 PM EDT ----- Your vitamin D is low. I will send a prescription for weekly vitamin D to the pharmacy. We will continue to monitor your vitamin D levels.

## 2018-07-04 NOTE — Telephone Encounter (Signed)
Called, no answer. Left a message. Thanks!  

## 2018-07-05 NOTE — Telephone Encounter (Signed)
Called, no answer. Left  A message

## 2018-07-13 ENCOUNTER — Other Ambulatory Visit: Payer: Self-pay

## 2018-07-13 ENCOUNTER — Telehealth: Payer: Self-pay | Admitting: Family Medicine

## 2018-07-13 MED ORDER — VITAMIN D (ERGOCALCIFEROL) 1.25 MG (50000 UNIT) PO CAPS: 50000.0000 [IU] | ORAL_CAPSULE | ORAL | 3 refills | Status: AC

## 2018-07-13 NOTE — Telephone Encounter (Signed)
Called, no answer. Left a message to call back. Thanks!  

## 2018-07-16 ENCOUNTER — Telehealth: Payer: Self-pay

## 2018-07-16 NOTE — Telephone Encounter (Signed)
Called and spoke with patient. She was asking if vitamin D deficiency can cause headaches. I advised her this maybe from vitamin  D being low but she needs to make sure she is well hydrated and can take otc ibuprofen/excedrin to help. Advised if this does not resolve she should make an appointment to come in. Thanks !

## 2018-09-12 ENCOUNTER — Encounter (HOSPITAL_COMMUNITY): Payer: Self-pay

## 2018-09-12 ENCOUNTER — Encounter (HOSPITAL_COMMUNITY): Payer: Self-pay | Admitting: *Deleted

## 2018-10-09 IMAGING — CT CT RENAL STONE PROTOCOL
2 of 4 series · 17 of 46 positions shown, 19 images · non-contrast
Comparison: None.

CLINICAL DATA: Bilateral flank pain. Urinary tract infection.
Elevated white count.

EXAM:
CT ABDOMEN AND PELVIS WITHOUT CONTRAST
TECHNIQUE: Multidetector CT imaging of the abdomen and pelvis was performed
following the standard protocol without IV contrast.

[Series 2: axial st · axial · 0.70mm/px · z∈[-446,-51]mm · 14 of 89 slices shown, 16 images]
[im 5/89  soft-tissue]
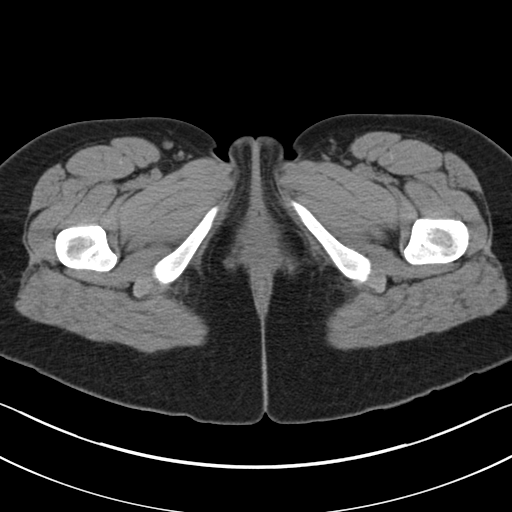
[im 5/89  bone]
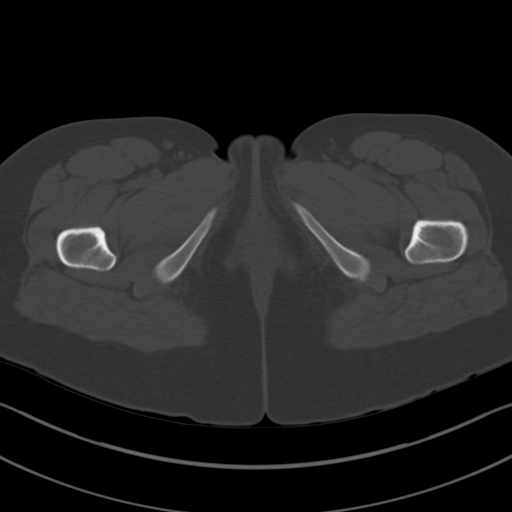
[im 14/89  soft-tissue]
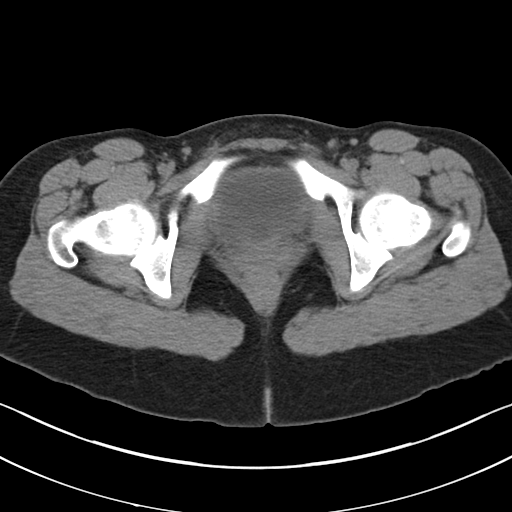
[im 18/89  soft-tissue]
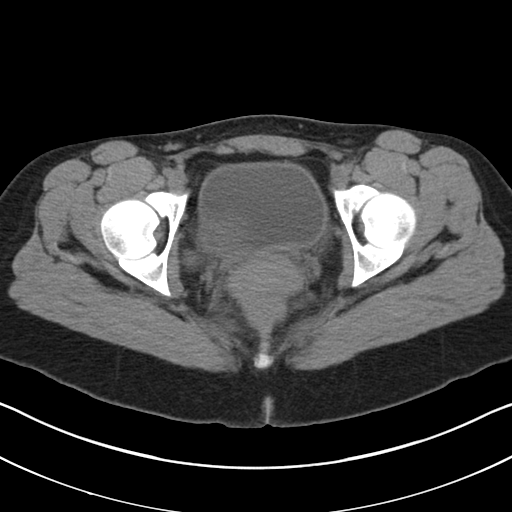
[im 23/89  soft-tissue]
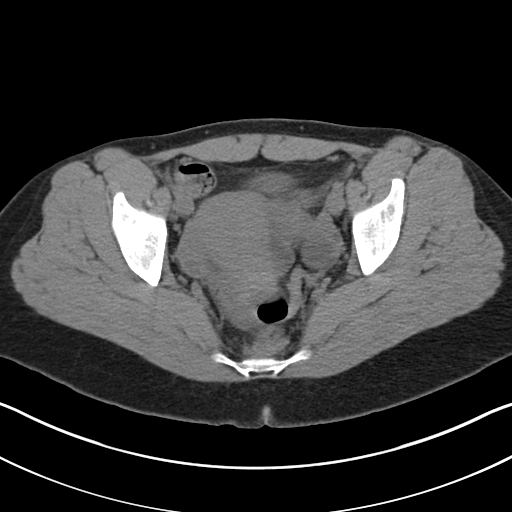
[im 31/89  soft-tissue]
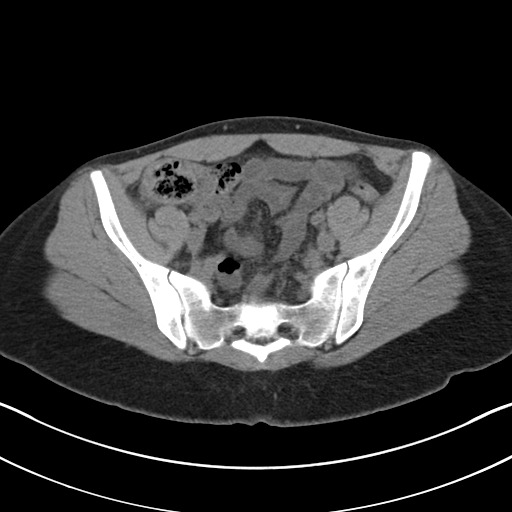
[im 36/89  soft-tissue]
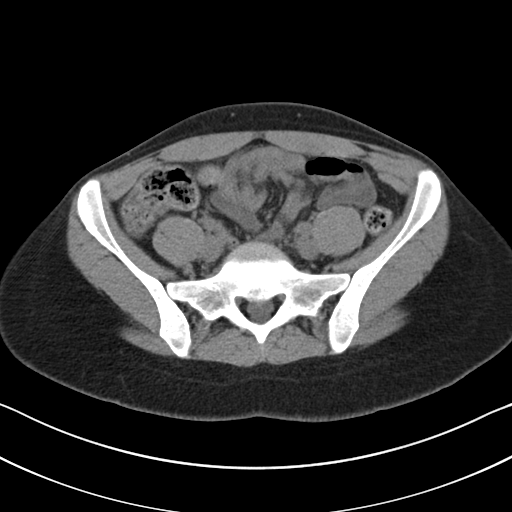
[im 40/89  soft-tissue]
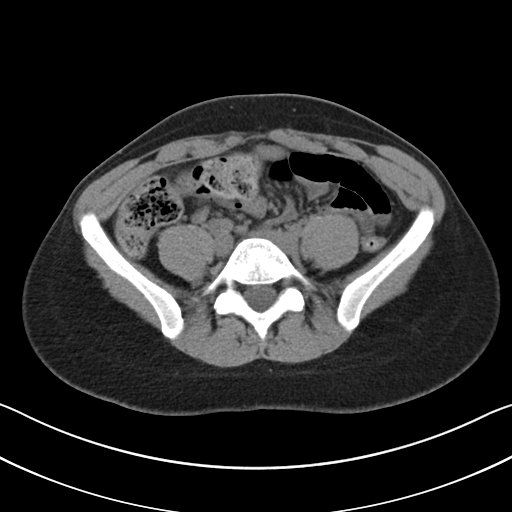
[im 49/89  soft-tissue]
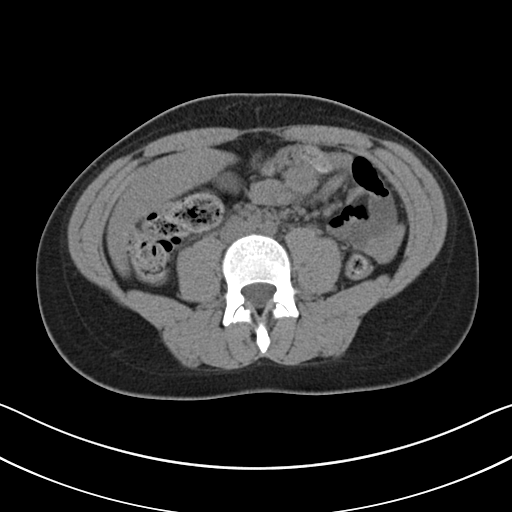
[im 53/89  soft-tissue]
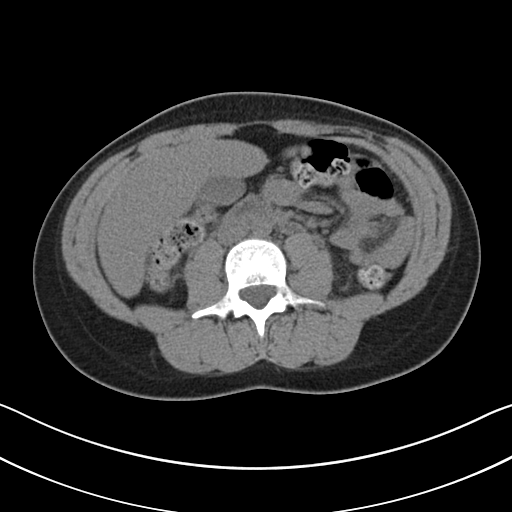
[im 53/89  bone]
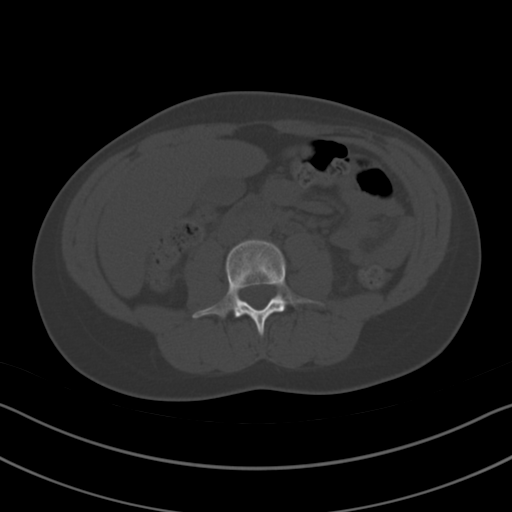
[im 58/89  soft-tissue]
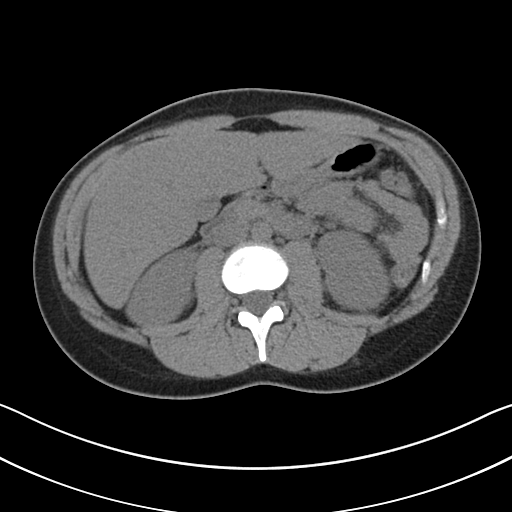
[im 67/89  soft-tissue]
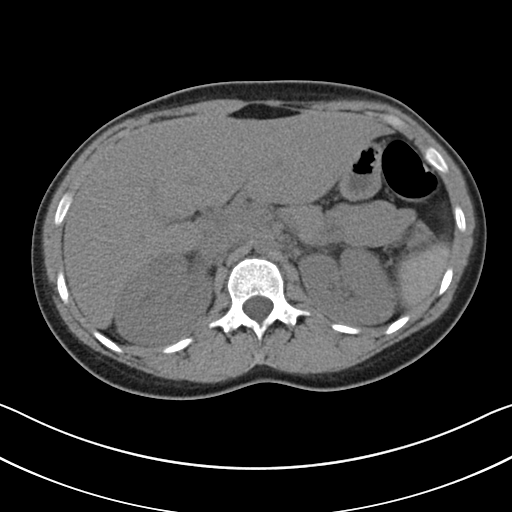
[im 71/89  soft-tissue]
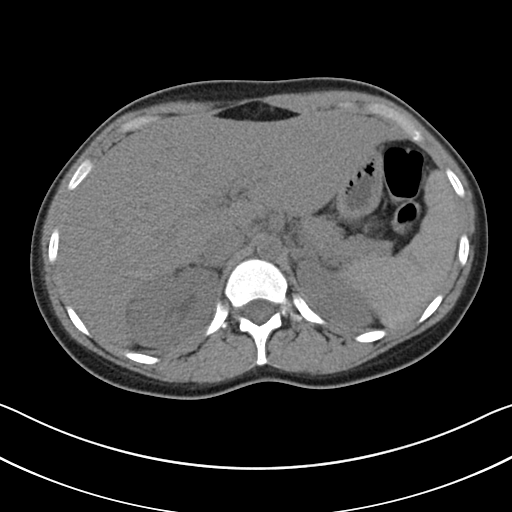
[im 75/89  soft-tissue]
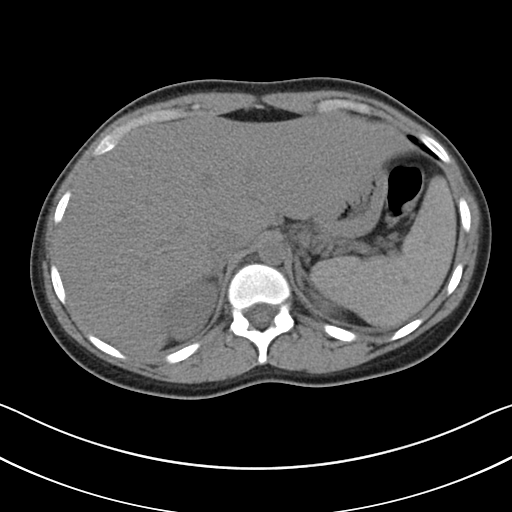
[im 84/89  soft-tissue]
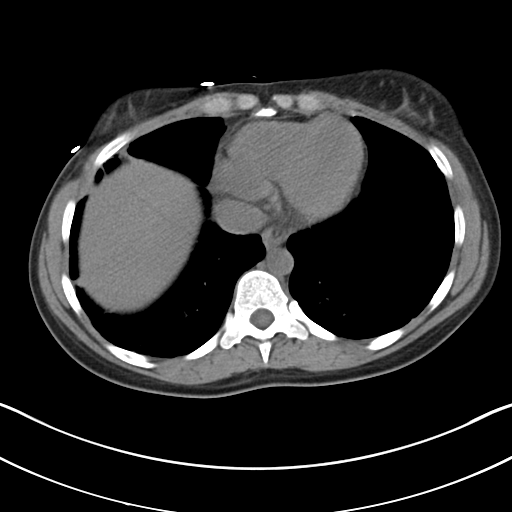

[Series 5: coronal · coronal · 0.87mm/px · 3 of 121 slices shown]
[im 41/121  soft-tissue]
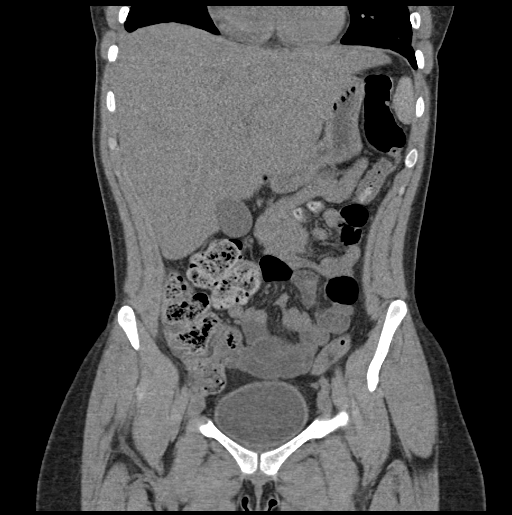
[im 54/121  soft-tissue]
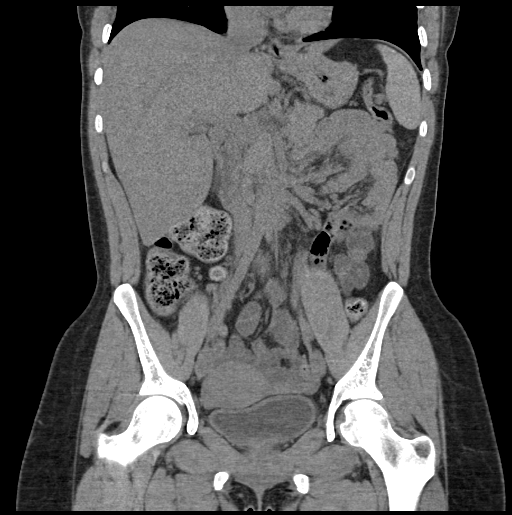
[im 67/121  soft-tissue]
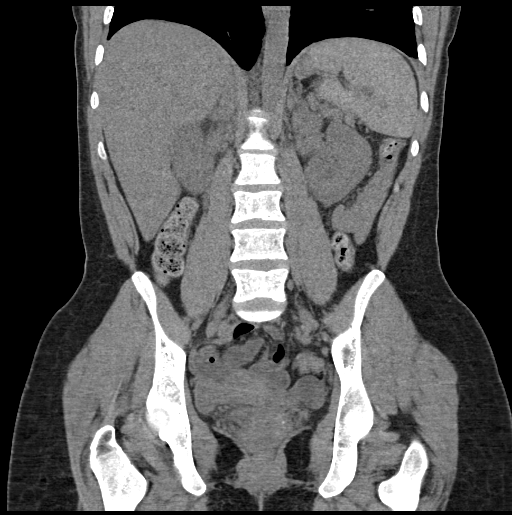

[17 of 46 positions shown; findings below may reference images not displayed]

FINDINGS: Lower chest: Mild abnormal density in both lower lungs consistent
with atelectasis. No pleural effusion.

Hepatobiliary: Normal without contrast.

Pancreas: Normal

Spleen: Non-specific 1.5 cm low-density in the upper portion, quite
likely benign and insignificant.

Adrenals/Urinary Tract: Adrenal glands are normal. Kidneys are
normal without contrast. No sign of surrounding inflammatory change
or detectable hydronephrosis. Bladder appears normal.

Stomach/Bowel: No abnormal bowel finding.

Vascular/Lymphatic: Normal

Reproductive: Normal.

Other: No free fluid or air.

Musculoskeletal: Normal
IMPRESSION: Negative noncontrast CT. No definable urinary tract pathology by
this exam. No stone disease. No sign of hydronephrosis.
